# Patient Record
Sex: Female | Born: 1980 | Race: Asian | Hispanic: No | Marital: Married | Smoking: Never smoker
Health system: Southern US, Community
[De-identification: ages and names within clinical notes are randomized; demographics above are authoritative.]

## PROBLEM LIST (undated history)

## (undated) DIAGNOSIS — J45909 Unspecified asthma, uncomplicated: Secondary | ICD-10-CM

## (undated) HISTORY — PX: NO PAST SURGERIES: SHX2092

---

## 2017-09-30 ENCOUNTER — Other Ambulatory Visit (HOSPITAL_COMMUNITY)
Admission: RE | Admit: 2017-09-30 | Discharge: 2017-09-30 | Disposition: A | Discharge status: Home / Self Care | Payer: BLUE CROSS/BLUE SHIELD | Source: Ambulatory Visit | Attending: Family Medicine | Admitting: Family Medicine

## 2017-09-30 ENCOUNTER — Other Ambulatory Visit: Payer: Self-pay | Admitting: Family Medicine

## 2017-09-30 DIAGNOSIS — Z01411 Encounter for gynecological examination (general) (routine) with abnormal findings: Secondary | ICD-10-CM | POA: Diagnosis present

## 2017-10-01 LAB — CYTOLOGY - PAP
DIAGNOSIS: NEGATIVE
HPV (WINDOPATH): NOT DETECTED

## 2019-06-30 ENCOUNTER — Other Ambulatory Visit: Payer: Self-pay

## 2019-06-30 ENCOUNTER — Inpatient Hospital Stay (HOSPITAL_COMMUNITY): Payer: 59

## 2019-06-30 ENCOUNTER — Encounter (HOSPITAL_COMMUNITY): Payer: Self-pay | Admitting: Obstetrics and Gynecology

## 2019-06-30 ENCOUNTER — Inpatient Hospital Stay (HOSPITAL_COMMUNITY)
Admission: AD | Admit: 2019-06-30 | Discharge: 2019-06-30 | Disposition: A | Discharge status: Home / Self Care | Payer: 59 | Attending: Obstetrics and Gynecology | Admitting: Obstetrics and Gynecology

## 2019-06-30 DIAGNOSIS — Z3A08 8 weeks gestation of pregnancy: Secondary | ICD-10-CM | POA: Insufficient documentation

## 2019-06-30 DIAGNOSIS — O208 Other hemorrhage in early pregnancy: Secondary | ICD-10-CM | POA: Insufficient documentation

## 2019-06-30 DIAGNOSIS — O09522 Supervision of elderly multigravida, second trimester: Secondary | ICD-10-CM | POA: Insufficient documentation

## 2019-06-30 DIAGNOSIS — O200 Threatened abortion: Secondary | ICD-10-CM

## 2019-06-30 DIAGNOSIS — O4690 Antepartum hemorrhage, unspecified, unspecified trimester: Secondary | ICD-10-CM

## 2019-06-30 LAB — URINALYSIS, ROUTINE W REFLEX MICROSCOPIC
Bacteria, UA: NONE SEEN
Bilirubin Urine: NEGATIVE
Glucose, UA: NEGATIVE mg/dL
Ketones, ur: NEGATIVE mg/dL
Leukocytes,Ua: NEGATIVE
Nitrite: NEGATIVE
Protein, ur: NEGATIVE mg/dL
Specific Gravity, Urine: 1.003 — ABNORMAL LOW (ref 1.005–1.030)
pH: 7 (ref 5.0–8.0)

## 2019-06-30 LAB — CBC
HCT: 40.2 % (ref 36.0–46.0)
Hemoglobin: 12.5 g/dL (ref 12.0–15.0)
MCH: 20 pg — ABNORMAL LOW (ref 26.0–34.0)
MCHC: 31.1 g/dL (ref 30.0–36.0)
MCV: 64.2 fL — ABNORMAL LOW (ref 80.0–100.0)
Platelets: 210 10*3/uL (ref 150–400)
RBC: 6.26 MIL/uL — ABNORMAL HIGH (ref 3.87–5.11)
RDW: 18.6 % — ABNORMAL HIGH (ref 11.5–15.5)
WBC: 8 10*3/uL (ref 4.0–10.5)
nRBC: 0 % (ref 0.0–0.2)

## 2019-06-30 LAB — ABO/RH: ABO/RH(D): B POS

## 2019-06-30 LAB — HCG, QUANTITATIVE, PREGNANCY: hCG, Beta Chain, Quant, S: 11578 m[IU]/mL — ABNORMAL HIGH (ref <5)

## 2019-06-30 LAB — WET PREP, GENITAL
Clue Cells Wet Prep HPF POC: NONE SEEN
Sperm: NONE SEEN
Trich, Wet Prep: NONE SEEN
Yeast Wet Prep HPF POC: NONE SEEN

## 2019-06-30 LAB — POCT PREGNANCY, URINE: Preg Test, Ur: POSITIVE — AB

## 2019-06-30 NOTE — MAU Provider Note (Signed)
History     CSN: [id number]  Arrival date and time: 06/30/19 1507   First Provider Initiated Contact with Patient 06/30/19 1554      Chief Complaint  Patient presents with  . Vaginal Bleeding   Tracy Stanley is a 39 y.o. G2P1001 at [redacted]w[redacted]d Definite LMP of 05/02/2019.  She states she has her initial prenatal appt scheduled, but does not know what office it is at.  She presents today for Vaginal Bleeding.  She reports she started having blood that started yesterday.  Initially it was dark red in color, but is now a light pink with white discharge. Patient denies abdominal cramping and states she has not had sexual activity since January.  Patient endorses that the bleeding was present upon arrival.       OB History    Gravida  2   Para  1   Term  1   Preterm      AB      Living  1     SAB      TAB      Ectopic      Multiple      Live Births              History reviewed. No pertinent past medical history.  History reviewed. No pertinent surgical history.  Family History  Problem Relation Age of Onset  . Hypertension Father     Social History   Tobacco Use  . Smoking status: Never Smoker  . Smokeless tobacco: Never Used  Substance Use Topics  . Alcohol use: Not Currently  . Drug use: Never    Allergies: No Known Allergies  No medications prior to admission.    Review of Systems  Constitutional: Positive for chills. Negative for fever.  Respiratory: Negative for cough and shortness of breath.   Gastrointestinal: Positive for abdominal pain.  Genitourinary: Positive for vaginal bleeding and vaginal discharge. Negative for difficulty urinating and dysuria.  Neurological: Negative for dizziness, light-headedness and headaches.   Physical Exam   Blood pressure 127/73, pulse 88, temperature 98.1 F (36.7 C), temperature source Oral, resp. rate 18, weight 59 kg, last menstrual period 05/02/2019, SpO2 100 %.  Physical Exam  Constitutional: She is  oriented to person, place, and time. She appears well-developed and well-nourished. No distress.  HENT:  Head: Normocephalic and atraumatic.  Cardiovascular: Normal rate.  Respiratory: Effort normal.  GI: Soft. There is no abdominal tenderness.  Genitourinary: Cervix exhibits discharge and friability.    No vaginal discharge or bleeding.  No bleeding in the vagina.    Genitourinary Comments: Speculum Exam: -Normal External Genitalia: Non tender, no apparent discharge at introitus.  -Vaginal Vault: Pink mucosa. No apparent blood or discharge in vault -wet prep collected -Cervix:Pink, no lesions, cysts, or polyps.  Appears open. No active bleeding from os, but -GC/CT collected -Bimanual Exam:  Deferred    Musculoskeletal:        General: Normal range of motion.     Cervical back: Normal range of motion.  Neurological: She is alert and oriented to person, place, and time.  Skin: Skin is warm and dry.  Psychiatric: She has a normal mood and affect. Her behavior is normal.    MAU Course  Procedures Results for orders placed or performed during the hospital encounter of 06/30/19 (from the past 24 hour(s))  Pregnancy, urine POC     Status: Abnormal   Collection Time: 06/30/19  3:28 PM  Result Value Ref  Range   Preg Test, Ur POSITIVE (A) NEGATIVE  Wet prep, genital     Status: Abnormal   Collection Time: 06/30/19  4:10 PM   Specimen: PATH Cytology Cervicovaginal Ancillary Only  Result Value Ref Range   Yeast Wet Prep HPF POC NONE SEEN NONE SEEN   Trich, Wet Prep NONE SEEN NONE SEEN   Clue Cells Wet Prep HPF POC NONE SEEN NONE SEEN   WBC, Wet Prep HPF POC FEW (A) NONE SEEN   Sperm NONE SEEN   ABO/Rh     Status: None   Collection Time: 06/30/19  4:14 PM  Result Value Ref Range   ABO/RH(D) B POS    No rh immune globuloin      NOT A RH IMMUNE GLOBULIN CANDIDATE, PT RH POSITIVE Performed at [facility], [place] [address], [place], [place] [address]   CBC     Status: Abnormal    Collection Time: 06/30/19  4:14 PM  Result Value Ref Range   WBC 8.0 4.0 - 10.5 K/uL   RBC 6.26 (H) 3.87 - 5.11 MIL/uL   Hemoglobin 12.5 12.0 - 15.0 g/dL   HCT 40.2 36.0 - 46.0 %   MCV 64.2 (L) 80.0 - 100.0 fL   MCH 20.0 (L) 26.0 - 34.0 pg   MCHC 31.1 30.0 - 36.0 g/dL   RDW 18.6 (H) 11.5 - 15.5 %   Platelets 210 150 - 400 K/uL   nRBC 0.0 0.0 - 0.2 %  hCG, quantitative, pregnancy     Status: Abnormal   Collection Time: 06/30/19  4:14 PM  Result Value Ref Range   hCG, Beta Chain, Quant, S 11,578 (H) <5 mIU/mL  Urinalysis, Routine w reflex microscopic     Status: Abnormal   Collection Time: 06/30/19  5:00 PM  Result Value Ref Range   Color, Urine STRAW (A) YELLOW   APPearance CLEAR CLEAR   Specific Gravity, Urine 1.003 (L) 1.005 - 1.030   pH 7.0 5.0 - 8.0   Glucose, UA NEGATIVE NEGATIVE mg/dL   Hgb urine dipstick SMALL (A) NEGATIVE   Bilirubin Urine NEGATIVE NEGATIVE   Ketones, ur NEGATIVE NEGATIVE mg/dL   Protein, ur NEGATIVE NEGATIVE mg/dL   Nitrite NEGATIVE NEGATIVE   Leukocytes,Ua NEGATIVE NEGATIVE   RBC / HPF 0-5 0 - 5 RBC/hpf   WBC, UA 0-5 0 - 5 WBC/hpf   Bacteria, UA NONE SEEN NONE SEEN   Squamous Epithelial / LPF 0-5 0 - 5   US OB LESS THAN 14 WEEKS WITH OB TRANSVAGINAL  Result Date: 06/30/2019 CLINICAL DATA:  Vaginal bleeding EXAM: OBSTETRIC <14 WK [place] AND TRANSVAGINAL OB US TECHNIQUE: Both transabdominal and transvaginal ultrasound examinations were performed for complete evaluation of the gestation as well as the maternal uterus, adnexal regions, and pelvic cul-de-sac. Transvaginal technique was performed to assess early pregnancy. COMPARISON:  None. FINDINGS: Intrauterine gestational sac: Single Yolk sac:  Visualized. Embryo:  Not Visualized. Cardiac Activity: Not Visualized. MSD: 13.5 mm   6 w   1 d Subchorionic hemorrhage: There is a moderate volume of subchorionic hemorrhage. Maternal uterus/adnexae: There is no significant maternal abnormality detected. No significant  free fluid. IMPRESSION: Single IUP at 6 weeks and 1 day without evidence for an embryo. Given the patient is greater than 6 weeks post last menstrual period, findings are suspicious for a failed pregnancy. There is a moderate volume of subchorionic hemorrhage. Findings are suspicious but not yet definitive for failed pregnancy. Recommend follow-up US in 10-14  days for definitive diagnosis. This recommendation follows SRU consensus guidelines: Diagnostic Criteria for Nonviable Pregnancy Early in the First Trimester. Malva Limes Med 2013; 751:0258-52. Electronically Signed   By: Katherine Mantle M.D.   On: 06/30/2019 16:39    MDM Pelvic Exam; Wet Prep and GC/CT Labs: UA, UPT, CBC, hCG, ABO Ultrasound Assessment and Plan  39 year old G2P1001 at 8.3 weeks Vaginal Bleeding Language Barrier  -POC reviewed. -Patient expresses concern with vaginal exam questioning if it will cause miscarriage. -Provider reassures patient that if a miscarriage is occurring, unfortunately pelvic exams or sexual activity does not contribute to it. -Patient informed that she can decline speculum exam, but that it is beneficial in identifying source of bleeding. -Patient declines opting out of exam and states "I trust you." -Exam performed and results discussed. -Cultures collected and pending.  -Labs ordered -Will send for [place] and await results.   Cherre Robins 06/30/2019, 3:55 PM   Reassessment (5:50 PM) Vaginal Bleeding Suspicious for Failed Pregnancy SCH B Positive  -[place] and Lab results return as above. -In room to discuss results with patient with assistance of [employer] Language Line Interpreter Tracy Stanley [phone]. -Extensive review of quant levels and current GA based on LMP. -Patient informed that finding suspicious for miscarriage and that follow up ultrasound would confirm. -Educated on [facility], what to expect including anticipated bleeding and increased risks for miscarriage.  -Patient instructed to have  her husband reschedule her initial PNV for 2 weeks to allow for confirmation of pregnancy vs miscarriage. -Patient verifies that she would have to get an [place] to confirm miscarriage. -Provider reiterated how dates, US findings, and lab results are indicative of likely miscarriage, but that the [place] would truly confirm. -Bleeding Precautions Given -Patient without further questions or concerns.  -Encouraged to call or return to MAU if symptoms worsen or with the onset of new symptoms. -Discharged to home in stable condition.  Cherre Robins MSN, CNM Advanced Practice Provider, Center for [employer]

## 2019-06-30 NOTE — Discharge Instructions (Signed)
Threatened Miscarriage  A threatened miscarriage occurs when a woman has vaginal bleeding during the first 20 weeks of pregnancy but the pregnancy has not ended. If you have vaginal bleeding during this time, your health care provider will do tests to make sure you are still pregnant. If the tests show that you are still pregnant and that the developing baby (fetus) inside your uterus is still growing, your condition is considered a threatened miscarriage. A threatened miscarriage does not mean your pregnancy will end, but it does increase the risk of losing your pregnancy (complete miscarriage). What are the causes? The cause of this condition is usually not known. For women who go on to have a complete miscarriage, the most common cause is an abnormal number of chromosomes in the developing baby. Chromosomes are the structures inside cells that hold all of a person's genetic material. What increases the risk? The following lifestyle factors may increase your risk of a miscarriage in early pregnancy:  Smoking.  Drinking excessive amounts of alcohol or caffeine.  Recreational drug use. The following preexisting health conditions may increase your risk of a miscarriage in early pregnancy:  Polycystic ovary syndrome.  Uterine fibroids.  Infections.  Diabetes mellitus. What are the signs or symptoms? Symptoms of this condition include:  Vaginal bleeding.  Mild abdominal pain or cramps. How is this diagnosed? If you have bleeding with or without abdominal pain before 20 weeks of pregnancy, your health care provider will do tests to check whether you are still pregnant. These will include:  Ultrasound. This test uses sound waves to create images of the inside of your uterus. This allows your health care provider to look at your developing baby and other structures, such as your placenta.  Pelvic exam. This is an internal exam of your vagina and cervix.  Measurement of your baby's heart  rate.  Laboratory tests such as blood tests, urine tests, or swabs for infection You may be diagnosed with a threatened miscarriage if:  Ultrasound testing shows that you are still pregnant.  Your baby's heart rate is strong.  A pelvic exam shows that the opening between your uterus and your vagina (cervix) is closed.  Blood tests confirm that you are still pregnant. How is this treated? No treatments have been shown to prevent a threatened miscarriage from going on to a complete miscarriage. However, the right home care is important. Follow these instructions at home:  Get plenty of rest.  Do not have sex or use tampons if you have vaginal bleeding.  Do not douche.  Do not smoke or use recreational drugs.  Do not drink alcohol.  Avoid caffeine.  Keep all follow-up prenatal visits as told by your health care provider. This is important. Contact a health care provider if:  You have light vaginal bleeding or spotting while pregnant.  You have abdominal pain or cramping.  You have a fever. Get help right away if:  You have heavy vaginal bleeding.  You have blood clots coming from your vagina.  You pass tissue from your vagina.  You leak fluid, or you have a gush of fluid from your vagina.  You have severe low back pain or abdominal cramps.  You have fever, chills, and severe abdominal pain. Summary  A threatened miscarriage occurs when a woman has vaginal bleeding during the first 20 weeks of pregnancy but the pregnancy has not ended.  The cause of a threatened miscarriage is usually not known.  Symptoms of this condition may   include vaginal bleeding and mild abdominal pain or cramps.  No treatments have been shown to prevent a threatened miscarriage from going on to a complete miscarriage.  Keep all follow-up prenatal visits as told by your health care provider. This is important. This information is not intended to replace advice given to you by your health  care provider. Make sure you discuss any questions you have with your health care provider. Document Revised: 05/28/2017 Document Reviewed: 07/18/2016 Elsevier Patient Education  2020 Elsevier Inc.  

## 2019-06-30 NOTE — MAU Note (Signed)
.   Tracy Stanley is a 39 y.o. at [redacted]w[redacted]d here in MAU reporting: Patient had VB that started yesterday morning at 1100 that was dark red and she also passed a small blood clot. She states that the bleeding has continued today but is now only spotting and she also has thick white discharge. Patient states she had lower abdominal pain this morning she would rate 3/10 but no current abdominal pain.   Pain score: 0 Vitals:   06/30/19 1533  BP: 127/73  Pulse: 88  Resp: 18  Temp: 98.1 F (36.7 C)  SpO2: 100%

## 2019-07-01 LAB — GC/CHLAMYDIA PROBE AMP (~~LOC~~) NOT AT ARMC
Chlamydia: NEGATIVE
Comment: NEGATIVE
Comment: NORMAL
Neisseria Gonorrhea: NEGATIVE

## 2019-07-04 ENCOUNTER — Inpatient Hospital Stay (HOSPITAL_COMMUNITY): Payer: 59

## 2019-07-04 ENCOUNTER — Inpatient Hospital Stay (HOSPITAL_COMMUNITY)
Admission: AD | Admit: 2019-07-04 | Discharge: 2019-07-04 | Disposition: A | Discharge status: Home / Self Care | Payer: 59 | Attending: Obstetrics and Gynecology | Admitting: Obstetrics and Gynecology

## 2019-07-04 ENCOUNTER — Other Ambulatory Visit: Payer: Self-pay

## 2019-07-04 DIAGNOSIS — O039 Complete or unspecified spontaneous abortion without complication: Secondary | ICD-10-CM | POA: Diagnosis not present

## 2019-07-04 DIAGNOSIS — O209 Hemorrhage in early pregnancy, unspecified: Secondary | ICD-10-CM | POA: Diagnosis present

## 2019-07-04 LAB — URINALYSIS, ROUTINE W REFLEX MICROSCOPIC
Bilirubin Urine: NEGATIVE
Glucose, UA: NEGATIVE mg/dL
Ketones, ur: NEGATIVE mg/dL
Leukocytes,Ua: NEGATIVE
Nitrite: NEGATIVE
Protein, ur: NEGATIVE mg/dL
Specific Gravity, Urine: 1.002 — ABNORMAL LOW (ref 1.005–1.030)
pH: 6 (ref 5.0–8.0)

## 2019-07-04 MED ORDER — OXYCODONE-ACETAMINOPHEN 5-325 MG PO TABS
2.0000 | ORAL_TABLET | Freq: Once | ORAL | Status: AC
  Administered 2019-07-04: 2 via ORAL
  Filled 2019-07-04: qty 2

## 2019-07-04 NOTE — MAU Provider Note (Signed)
Chief Complaint: Vaginal Bleeding   First Provider Initiated Contact with Patient 07/04/19 2056     *video [place] interpreter used for this encounter*  SUBJECTIVE HPI: Tracy Stanley is a 39 y.o. G2P1001 at [redacted]w[redacted]d by sure LMP who presents to Maternity Admissions reporting vaginal bleeding. Was in MAU last week & diagnosed with threatened miscarriage. Was supposed to go to an ob/gyn where she was scheduled for prenatal care, but doesn't know the name of the office and ended up cancelling her appointment.  Reports heavy bleeding & cramping today from 4-6 pm. Was saturating pads and passing small clots. Passed something that looked like it may have been tissue. Since the her abdominal pain has improved & her bleeding has slowed down. Currently rates lower abdominal pain as 6/10 on pain scale. Describes as intermittent cramping. Has not treated symptoms. Nothing makes pain worse. Pain started earlier today.    No past medical history on file. OB History  Gravida Para Term Preterm AB Living  2 1 1     1   SAB TAB Ectopic Multiple Live Births               # Outcome Date GA Lbr Len/2nd Weight Sex Delivery Anes PTL Lv  2 Current           1 Term 08/28/13    M Vag-Spont      No past surgical history on file. Social History   Socioeconomic History  . Marital status: Married    Spouse name: Not on file  . Number of children: Not on file  . Years of education: Not on file  . Highest education level: Not on file  Occupational History  . Not on file  Tobacco Use  . Smoking status: Never Smoker  . Smokeless tobacco: Never Used  Substance and Sexual Activity  . Alcohol use: Not Currently  . Drug use: Never  . Sexual activity: Not Currently  Other Topics Concern  . Not on file  Social History Narrative  . Not on file   Social Determinants of Health   Financial Resource Strain:   . Difficulty of Paying Living Expenses: Not on file  Food Insecurity:   . Worried About Charity fundraiser  in the Last Year: Not on file  . Ran Out of Food in the Last Year: Not on file  Transportation Needs:   . Lack of Transportation (Medical): Not on file  . Lack of Transportation (Non-Medical): Not on file  Physical Activity:   . Days of Exercise per Week: Not on file  . Minutes of Exercise per Session: Not on file  Stress:   . Feeling of Stress : Not on file  Social Connections:   . Frequency of Communication with Friends and Family: Not on file  . Frequency of Social Gatherings with Friends and Family: Not on file  . Attends Religious Services: Not on file  . Active Member of Clubs or Organizations: Not on file  . Attends Archivist Meetings: Not on file  . Marital Status: Not on file  Intimate Partner Violence:   . Fear of Current or Ex-Partner: Not on file  . Emotionally Abused: Not on file  . Physically Abused: Not on file  . Sexually Abused: Not on file   Family History  Problem Relation Age of Onset  . Hypertension Father    No current facility-administered medications on file prior to encounter.   No current outpatient medications on file prior  to encounter.   No Known Allergies  I have reviewed patient's Past Medical Hx, Surgical Hx, Family Hx, Social Hx, medications and allergies.   Review of Systems  Constitutional: Negative.   Gastrointestinal: Positive for abdominal pain.  Genitourinary: Positive for vaginal bleeding.    OBJECTIVE Patient Vitals for the past 24 hrs:  BP Temp Pulse Resp  07/04/19 2336 116/78 -- 70 16  07/04/19 2026 111/70 98.5 F (36.9 C) 73 17   Constitutional: Well-developed, well-nourished female in no acute distress.  Cardiovascular: normal rate & rhythm, no murmur Respiratory: normal rate and effort. Lung sounds clear throughout GI: Abd soft, non-tender, Pos BS x 4. No guarding or rebound tenderness MS: Extremities nontender, no edema, normal ROM Neurologic: Alert and oriented x 4.      LAB RESULTS Results for orders  placed or performed during the hospital encounter of 07/04/19 (from the past 24 hour(s))  Urinalysis, Routine w reflex microscopic     Status: Abnormal   Collection Time: 07/04/19  8:48 PM  Result Value Ref Range   Color, Urine AMBER (A) YELLOW   APPearance CLEAR CLEAR   Specific Gravity, Urine 1.002 (L) 1.005 - 1.030   pH 6.0 5.0 - 8.0   Glucose, UA NEGATIVE NEGATIVE mg/dL   Hgb urine dipstick LARGE (A) NEGATIVE   Bilirubin Urine NEGATIVE NEGATIVE   Ketones, ur NEGATIVE NEGATIVE mg/dL   Protein, ur NEGATIVE NEGATIVE mg/dL   Nitrite NEGATIVE NEGATIVE   Leukocytes,Ua NEGATIVE NEGATIVE   RBC / HPF 0-5 0 - 5 RBC/hpf   WBC, UA 0-5 0 - 5 WBC/hpf   Bacteria, UA FEW (A) NONE SEEN   Squamous Epithelial / LPF 0-5 0 - 5    IMAGING US OB Transvaginal  Result Date: 07/04/2019 CLINICAL DATA:  39 year old female with pelvic pain and vaginal bleeding. EXAM: TRANSVAGINAL OB ULTRASOUND TECHNIQUE: Transvaginal ultrasound was performed for complete evaluation of the gestation as well as the maternal uterus, adnexal regions, and pelvic cul-de-sac. COMPARISON:  Pelvic ultrasound dated 06/30/2019. FINDINGS: The uterus is retroverted or retroflexed. The endometrium is thickened and slightly heterogeneous measuring up to 11 mm in thickness. No intrauterine pregnancy identified. A small complex hypoechoic collection in the lower endometrium likely represents blood product/clot. Retained products of conception is less likely but not excluded. Clinical correlation and follow-up with serial HCG levels recommended. The ovaries are unremarkable. There is a dominant follicle or cyst in the right ovary. No significant free fluid within the pelvis. IMPRESSION: Interval passage of the previously seen intrauterine gestational sac in keeping with spontaneous miscarriage. Small amount of blood/clot within the endometrium. Clinical correlation and follow-up with serial HCG levels recommended to exclude retained products of  conception. Electronically Signed   By: Elgie Collard M.D.   On: 07/04/2019 22:47    MAU COURSE Orders Placed This Encounter  Procedures  . US OB Transvaginal  . Urinalysis, Routine w reflex microscopic  . Discharge patient   Meds ordered this encounter  Medications  . oxyCODONE-acetaminophen (PERCOCET/ROXICET) 5-325 MG per tablet 2 tablet    MDM Vital signs stable Small amount of dark red blood on pad that has been in place for an hour  Ultrasound - IUP previously seen no longer there, c/w miscarriage  Discussed results with patient via [place] interpreter who is appropriately upset. Discussed follow up with her - her preference would be to come to our [place] office - I will send message to schedule SAB appt  Pt requesting pain medication prior to  discharge - percocet given & pain rated 4/10 at time of discharge  RH positive  ASSESSMENT 1. Miscarriage   2. Vaginal bleeding in pregnancy, first trimester     PLAN Discharge home in stable condition. Bleeding & infection precautions Msg to [facility] for SAB f/u appt  Follow-up Information    [facility] Follow up.   Specialty: Obstetrics and Gynecology Why: the office will call you to schedule your follow up appointment Contact information: [address] Floor, [address] [id number] [address] [address] [phone]         Allergies as of 07/04/2019   No Known Allergies     Medication List    You have not been prescribed any medications.      Judeth Horn, NP 07/05/2019  12:14 AM

## 2019-07-04 NOTE — MAU Note (Signed)
Patient reports an increase in her bleeding and abdominal pain.  Has not taken anything for her pain.  States she has been passing lemon sized blood clots.  Also passed something that looked like tissue.

## 2019-07-04 NOTE — Discharge Instructions (Signed)
S?y [place] Miscarriage S?y thai l [place] nhi (bo [place]) b? ra kh?i t? cung tr??c tu?n th? 20 c?a [place] k?. H?u h?t cc tr??ng h?p s?y [place] x?y ra trong 3 thng ??u c?a [place] k?. ?i khi, s?y [place] c th? x?y ra tr??c khi m?t ng??i ph? n? bi?t r?ng mnh mang [place]. B? s?y thai c th? l m?t tr?i nghi?m c?m xc. N?u qu v? b? s?y [place], hy trao ??i v?i chuyn gia ch?m [place] s?c kh?e v? b?t k? cu h?i no qu v? c th? c v? s?y [place], qu trnh ?au bu?n v k? ho?ch mang thai trong t??ng lai. Nguyn nhn g gy ra? S?y thai c th? la? do:  Cc v?n ?? v?i gen ho?c nhi?m s?c th? c?a [place] nhi. Nh?ng v?n ?? ny khi?n cho thai nhi khng th? pht tri?n bnh th??ng. Cc v?n ?? ? th??ng l k?t qu? c?a cc sai st ng?u nhin x?y ra ? giai ?o?n ??u trong qu trnh pht tri?n c?a em b v khng ???c truy?n t? cha m? sang con Multimedia programmer di truy?n).  Nhi?m trng c? t? cung ho?c t? cung.  Cc tnh tr?ng ?nh h??ng ??n cn b?ng hc mn trong c? th?.  Cc v?n ?? v?i c? t? cung, ch?ng h?n nh? c? t? cung m? ho?c m?ng tr??c khi ??n k? sinh (suy c? t? cung).  Cc v?n ?? v?i t? cung. Nh?ng v?n ?? ny c th? bao g?m: ? T? cung c hnh d?ng b?t th??ng. ? U x? t? cung. ? Cc b?t th??ng b?m sinh. ?y l nh?ng v?n ?? c vo lc sinh ra.  M?t s? tnh tr?ng b?nh l nh?t ??nh.  Ht thu?c l, u?ng r??u ho?c dng ma ty.  T?n th??ng (ch?n th??ng). Trong nhi?u tr??ng h?p, khng r nguyn nhn gy s?y [place]. Cc d?u hi?u ho?c tri?u ch?ng l g? Nh?ng tri?u ch?ng c?a tnh tr?ng ny bao g?m:  Ch?y mu ho?c ??m mu ? m ??o, c ho?c khng b? co th?t ho?c ?au.  ?au ho?c co th?t ? b?ng ho?c th?t l?ng.  Ra d?ch, m ho?c c?c mu ?ng ? m ??o. Ch?n ?on tnh tr?ng ny nh? th? no? Tnh tr?ng ny c th? ???c ch?n ?on d?a vo:  Khm th?c th?.  Siu m.  Xt nghi?m mu.  Xt nghi?m n??c ti?u. Tnh tr?ng ny ???c ?i?u tr? nh? th? no? ?i khi khng c?n ?i?u tr? s?y [place] n?u t?t c? m bo [place] trong t? cung thot ra ngoi m?t cch  t? nhin. N?u c?n, tnh tr?ng ny c th? ???c ?i?u tr? b?ng:  Nong v n?o (D[employer]C). ?y l m?t th? thu?t trong ? c? t? cung ???c nong m? ra v l?p nim m?c t? cung (n?i m?c) ???c n?o ra. Th? thu?t ny ch? ???c th?c hi?n n?u m bo [place] ho?c nhau thai v?n cn trong c? th? (s?y thai khng hon ton).  Thu?c, ch?ng h?n nh?: ? Thu?c khng sinh ?? ?i?u tr? nhi?m trng. ? Thu?c ?? gip c? th? ??y b?t c? ph?n m no cn l?i ra. ? Thu?c ?? gi?m (co) kch th??c c?a t? cung. Nh?ng thu?c ny c th? ???c dng n?u qu v? ch?y nhi?u mu. N?u qu v? c nhm mu Rh m tnh ho?c con qu v? c Rh d??ng tnh, qu v? s? c?n m?t m?i thu?c tim tn l Rh globulin mi?n d?ch?? b?o v? cc con sau ny c?a qu v? kh?i b? cc v?n ??  v? nhm mu Rh. "Rh m tnh" v "Rh d??ng tnh" ?? c?p ??n vi?c mu c m?t lo?i protein ??c hi?u tm th?y trn b? m?t h?ng c?u (y?u t? Rh) hay khng. Tun th? nh?ng h??ng d?n ny ? nh: Thu?c   Ch? s? d?ng thu?c khng k ??n v thu?c k ??n theo ch? d?n c?a chuyn gia ch?m [place] s?c kh?e.  N?u qu v? ???c k thu?c khng sinh, hy dng thu?c theo ch? d?n c?a chuyn gia ch?m [place] s?c kh?e. Khng d?ng u?ng thu?c khng sinh ngay c? khi qu v? b?t ??u c?m th?y ?? h?n.  Khng dng thu?c ch?ng vim khng steroid (NSAID), ch?ng h?n nh? aspirin v ibuprofen, tr? khi ???c chuyn gia ch?m [place] s?c kh?e cho php. Nh?ng thu?c ny c th? gy ch?y mu. Ho?t ??ng  Ngh? ng?i theo ch? d?n. H?i chuyn gia ch?m [place] s?c kh?e v? cc ho?t ??ng no an ton cho qu v?.  Nh? ai ? gip gi?i quy?t cc trch nhi?m gia ?nh v vi?c nh trong th?i gian ny. H??ng d?n chung  Theo di s? l??ng b?ng v? sinh qu v? s? d?ng m?i ngy v m?c ?? ??t (bo [place]) c?a b?ng v? sinh. Ghi l?i thng tin ny.  Theo di l??ng m ho?c c?c mu ?ng ra kh?i m ??o c?a qu v?. Gi? l?i b?t c? l??ng m no l?n cho chuyn gia ch?m [place] s?c kh?e ki?m tra.  Khng s?? du?ng nu?t b?ng v? sinh, thu?t r??a ho?c quan h? tnh d?c cho ??n khi ???c chuyn  gia ch?m so?c s??c kho?e ch?p thu?n.  ?? gip qu v? v b?n tnh c?a qu v? v??t qua giai ?o?n ?au bu?n, hy trao ??i v?i chuyn gia ch?m [place] s?c kh?e ho?c tm ng??i t? v?n.  Khi qu v? s?n sng, hy g?p chuyn gia ch?m [place] s?c kh?e ?? th?o lu?n v? b?t c? b??c quan tr?ng no c?n th?c hi?n v s?c kh?e c?a qu v?. ??ng th?i, th?o lu?n nh?ng b??c qu v? c?n th?c hi?n ?? c m?t thai k? kh?e m?nh trong t??ng lai.  Tun th? t?t c? cc l?n khm theo di theo ch? d?n c?a chuyn gia ch?m [place] s?c kh?e. ?i?u ny c vai tr quan tr?ng. N?i ?? tm thm thng tin  Hi?p h?i S?c kh?e S?n Ph? [place] Giovanni K?: [email or web address]  B? Y t? v D?ch v? [place] Ph? n?: [email or web address] Hy lin l?c v?i chuyn gia ch?m [place] s?c kh?e n?u:  Qu v? b? s?t ho?c ?n l?nh.  Qu v? c kh h? ? m ??o mi kh ch?u.  Qu v? b? ch?y mu nhi?u h?n thay v t h?n. Yu c?u tr? gip ngay l?p t?c n?u:  Qu v? b? co th?t n?ng ho?c ?au r?t nhi?u ? l?ng ho?c b?ng.  Qu v? c c?c mu ?ng ho?c m thot ra kh?i m ??o c kch th??c b?ng h?t c ch tr? ln.  Qu v? lm ??t h?n 1 mi?ng b?ng v? sinh bnh th??ng trong m?t gi?Sander Nephew v? b? chong vng ho?c ?m y?u.  Qu v? b? b?t t?nh.  Qu v? c c?m gic bu?n xm chi?m t? t??ng c?a mnh ho?c c suy ngh? t? lm h?i mnh. Tm t?t  H?u h?t cc tr??ng h?p s?y [place] x?y ra trong 3 thng ??u c?a [place] k?. ?i khi s?y [place] x?y ra tr??c c? khi ng??i ph? n? bi?t mnh c [place].  Tun th? ch? ??n c?a chuyn gia ch?m [place] s?c kh?e v? vi?c ch?m [place] ? nh. Tun theo t?t c? cc cu?c h?n khm l?i.  ?? gip qu v? v b?n tnh c?a qu v? v??t qua giai ?o?n ?au bu?n, hy trao ??i v?i chuyn gia ch?m [place] s?c kh?e ho?c tm ng??i t? v?n. Thng tin ny khng nh?m m?c ?ch thay th? cho l?i khuyn m chuyn gia ch?m [place] s?c kh?e ni v?i qu v?. Hy b?o ??m qu v? ph?i th?o lu?n b?t k? v?n ?? g m qu v? c v?i chuyn gia ch?m  s?c kh?e c?a qu v?. Document Revised:  10/07/2016 Document Reviewed: 10/07/2016 Elsevier Patient Education  2020 Elsevier Inc.   Qu?n l tnh tr?ng m?t thai Managing Pregnancy Loss M?t [place] c th? x?y ra b?t c? lc no trong khi mang [place]. Th??ng khng bi?t nguyn nhn. Hi?m khi nguyn nhn l do b?t c? ?i?u g qu v? ? lm. M?t thai trong th?i k? ??u mang thai (trong ba thng mang thai ??u tin) ???c g?i l s?y [place]. Lo?i m?t thai ny l th??ng g?p nh?t. M?t [place] x?y ra sau 20 tu?n mang thai ???c g?i l t? vong [place] nhi n?u tim c?a em b ng?ng ??p tr??c khi sinh. T? vong thai nhi t ph? bi?n h?n nhi?u. M?t s? ph? n? c chuy?n d? t? nhin ngay sau khi [place] nhi t? vong r?i sinh ra [place] nhi ? ch?t (thai ch?t l?u). B?t k? tr??ng h?p m?t thai no c?ng c th? gy suy s?p. Qu v? s? c?n ph?c h?i c? th? ch?t v c?m xc. H?u h?t n? gi?i c th? mang thai tr? l?i sau khi m?t thai v sinh con kh?e m?nh. Cch ki?m sot s? ph?c h?i c?m xc  M?t thai l chuy?n r?t ?au lng. Qu v? c th? c?m th?y nhi?u c?m xc khc nhau trong khi ?au bu?n. Qu v? c th? c?m th?y bu?n v t?c gi?n. Qu v? c?ng c th? c?m th?y t?i l?i. Qu v? khc nhi?u l bnh th??ng. Vi?c ph?c h?i c?m xc c th? m?t nhi?u th?i gian h?n l ph?c h?i th? ch?t. M?i ng??i ph?c h?i khc nhau. Th?c hi?n cc b??c ny c th? gip qu v? ki?m sot m?t mt ny:  Hy nh? r?ng qu v? khng c kh? n?ng lm b?t k? ?i?u g gy ra m?t [place].  Hy chia s? suy ngh? v c?m xc v?i b?n b, gia ?nh v ng??i ph?i ng?u c?a qu v?. Hy nh? r?ng ng??i ph?i ng?u c?a qu v? c?ng ?ang ph?c h?i c?m xc.  Hy ch?c ch?n r?ng qu v? c m?t h? th?ng h? tr? t?t. Khng dnh qu nhi?u th?i gian ? m?t mnh.  G?p chuyn gia t? v?n m?t thai ho?c tham gia nhm h? tr? m?t [place].  Ng? ?? gi?c v ?n m?t ch? ?? ?n co? l??i cho s??c kho?e. Quay l?i ch? ?? t?p luy?n th??ng l? khi qu v? ? ph?c h?i th? ch?t.  Khng s? d?ng ma ty ho?c r??u ?? ki?m sot c?m xc c?a qu v?.  Cn nh?c g?p chuyn gia s?c kh?e tm th?n ??  gip qu v? ph?c h?i c?m xc.  Nh? b?n b ho?c ng??i thn gip qu v? quy?t ??nh ph?i lm g v?i qu?n o v cc mn ?? nui con m qu v? ? nh?n ???c cho con qu qu v?. Trong tr??ng h?p [place] ch?t l?u, nhi?u ph? n? h??ng l?i  t? vi?c th?c hi?n cc b??c b? sung trong qu trnh ?au bu?n. Qu v? c th? mu?n:  m con sau khi sinh.  ??t tn cho con.  Yu c?u c?p gi?y khai sinh.  T?o m?t v?t l?u ni?m nh? tranh in bn tay ho?c bn chn.  M?c qu?n o cho b v ch?p ?nh.  S?p x?p l? tang.  Yu c?u l? r?a t?i ho?c c?u nguy?n. Cc b?nh vi?n c nhn vin c th? gip qu v? s?p x?p t?t nh?ng nh?ng vi?c ny. Cch nh?n bi?t c?ng th?ng c?m xc Vi?c b? c?ng th?ng c?m xc sau khi m?t thai l bnh th??ng. Nh?ng c?ng th?ng c?m xc di?n ra trong th?i gian di ho?c tr? n?ng s? c?n ???c ?i?u tr?Marland Kitchen Hy theo di cc d?u hi?u c?a tnh tr?ng c?ng th?ng c?m xc ? m?c ?? n?ng:  Bu?n, t?c gi?n ho?c th?y t?i l?i khng h?t v gy ?nh h??ng x?u ??n cc ho?t ??ng bnh th??ng c?a qu v?.  Cc v?n ?? v? m?i quan h? ? x?y ra ho?c tr? nn tr?m tr?ng h?n do m?t [place].  Cc d?u hi?u tr?m c?m ko di h?n 2 tu?n. Nh?ng d?u hi?u ny c th? bao g?m: ? Bu?n b. ? Lo u. ? V v?ng. ? Khng cn [place] tm ??n cc ho?t ??ng qu v? yu thch. ? Khng th? t?p trung. ? Kh ng? ho?c ng? qu nhi?u. ? Chn ?n ho?c ?n qu nhi?u. ?  ngh? t? t? ho?c t? gy t?n th??ng b?n thn. Tun th? nh?ng h??ng d?n ny ? nh:  Ch? s? d?ng thu?c khng k ??n v thu?c k ??n theo ch? d?n c?a chuyn gia ch?m [place] s?c kh?e.  Ngh? ng?i t?i nh cho ??n khi m?c n?ng l??ng tr? l?i. Tr? l?i sinh ho?t bnh th??ng theo ch? d?n c?a chuyn gia ch?m [place] s?c kh?e. Hy h?i chuyn gia ch?m [place] s?c kh?e v? cc ho?t ??ng no l an ton cho qu v?.  Khi ? s?n sng, hy g?p chuyn gia ch?m [place] s?c kh?e ?? th?o lu?n cc b??c c?n th?c hi?n cho l?n mang thai sau ny.  Tun th? t?t c? cc l?n khm theo di theo ch? d?n c?a chuyn gia ch?m [place] s?c kh?e. ?i?u ny c vai  tr quan tr?ng. Tm ki?m tr? gip ? ?u  ?? gip qu v? v ng??i ph?i ng?u c?a qu v? v??t qua giai ?o?n ?au bu?n, hy trao ??i v?i chuyn gia ch?m [place] s?c kh?e ho?c tm ng??i t? v?n.  Cn nh?c g?p g? nh?ng ng??i khc ? b? m?t [place]. Hy h?i chuyn gia ch?m [place] s?c kh?e v? cc nhm h? tr? v ngu?n l?c. N?i ?? tm thm thng tin  [place] Department of Health and Cytogeneticist on [employer] (B? Y t? v D?ch v? Nhn sinh [place]?, V?n phng S?c kho? Ph? n?): http://[email or web address].[email or web address]/  American Pregnancy Association (Hi?p h?i Mang [place] M?): www.americanpregnancy.org Hy lin l?c v?i chuyn gia ch?m [place] s?c kh?e n?u:  Qu v? ti?p t?c th?y ?au kh?, bu?n b ho?c thi?u ??ng l?c cho cc ho?t ??ng hng ngy v nh?ng c?m xc ? khng c?i thi?n theo th?i gian.  Qu v? ?ang g?p kh kh?n ?? khi ph?c tnh c?m, ??c bi?t l n?u qu v? ?ang s? d?ng r??u ho?c cc ch?t ?? tr? gip. Yu c?u tr? gip ngay l?p t?c n?u:  Qu v? c suy ngh? t? lm t?n th??ng b?n thn ho?c ng??i  khc. N?u qu v? ? t?ng c?m th?y nh? c th? t? lm th??ng t?n b?n thn ho?c ng??i khc, ho?c c suy ngh? v? vi?c t? t?, hy yu c?u s? tr? gip ngay l?p t?c. Qu v? c th? ??n phng c?p c?u g?n nh?t ho?c g?i cho:  D?ch v? c?p c?u t?i ??a ph??ng (911 ? [place]?).  ???ng dy tr? gip kh?ng ho?ng t? t?, nh? l National Suicide Prevention Lifeline (???ng dy C?u sinh Qu?c gia Ng?n ng?a T? t?) theo s? [phone]. ???ng dy ny ho?t ??ng 24 gi? m?i ngy. Tm t?t  B?t k? l?n m?t thai no c?ng c th? kh kh?n v? c? th? ch?t v c?m xc.  Qu v? c th? tr?i qua nhi?u c?m xc khc nhau trong khi ?au bu?n. Vi?c ph?c h?i c?m xc c th? m?t nhi?u th?i gian h?n l ph?c h?i th? ch?t.  Vi?c b? c?ng th?ng c?m xc sau khi m?t thai l bnh th??ng. Nh?ng c?ng th?ng c?m xc di?n ra trong th?i gian di ho?c tr? n?ng s? c?n ???c ?i?u tr?.  Hy g?p chuyn gia ch?m [place] s?c kh?e n?u qu v? ?ang g?p kh kh?n sau khi m?t [place]. Thng tin ny khng nh?m m?c  ?ch thay th? cho l?i khuyn m chuyn gia ch?m [place] s?c kh?e ni v?i qu v?. Hy b?o ??m qu v? ph?i th?o lu?n b?t k? v?n ?? g m qu v? c v?i chuyn gia ch?m [place] s?c kh?e c?a qu v?. Document Revised: 08/03/2018 Document Reviewed: 08/03/2018 Elsevier Patient Education  2020 [employer].

## 2019-07-07 ENCOUNTER — Telehealth: Payer: Self-pay | Admitting: Obstetrics and Gynecology

## 2019-07-07 NOTE — Telephone Encounter (Addendum)
Pt's husband Ethelene Browns had also called office and left VM message regarding this issue. Per chart review, I do not see any notes form MAU visit on 3/1 that a prescription was given. I called pt's husband [phone]) who speaks English and got his voicemail. I left a message stating that I have some questions and will call back later today.   1610  Called and spoke w/pt's husband Ethelene Browns. He stated that his wife thought she was going to be prescribed Oxycodone for pain. He stated that the Rx has not been received by [facility] pharmacy. Per chart review, this information is not reflected in MAU notes from Judeth Horn on 07/04/19. I advised that [place] can take tylenol or Ibuprofen per package directions for pain. Pt has follow up appt in our office on 07/21/19 @ 1015. Ethelene Browns voiced understanding of all information and stated he will tell his wife.

## 2019-07-07 NOTE — Telephone Encounter (Signed)
Called the patient to inform of upcoming appointment. The patient verbalized understanding. Also stated she would like a letter with our office address as she does not have mychart setup.

## 2019-07-07 NOTE — Telephone Encounter (Signed)
The patient stated she was told she would have medication ready for her at [facility] however when she went to pick it up they said they did not have anything there. She would like a nurse to follow up. She has not been to our office as of yet. She had a miscarriage and has follow up with [place] in 2 weeks.

## 2019-07-21 ENCOUNTER — Ambulatory Visit (INDEPENDENT_AMBULATORY_CARE_PROVIDER_SITE_OTHER): Payer: 59 | Admitting: Obstetrics and Gynecology

## 2019-07-21 ENCOUNTER — Encounter: Payer: Self-pay | Admitting: Obstetrics and Gynecology

## 2019-07-21 ENCOUNTER — Other Ambulatory Visit: Payer: Self-pay

## 2019-07-21 VITALS — BP 105/67 | HR 68 | Ht 62.21 in | Wt 126.0 lb

## 2019-07-21 DIAGNOSIS — O039 Complete or unspecified spontaneous abortion without complication: Secondary | ICD-10-CM | POA: Diagnosis not present

## 2019-07-21 NOTE — Addendum Note (Signed)
Addended by: Duwaine Maxin on: 07/21/2019 10:56 AM   Modules accepted: Orders

## 2019-07-21 NOTE — Patient Instructions (Signed)
S?y [place] Miscarriage S?y thai l [place] nhi (bo [place]) b? ra kh?i t? cung tr??c tu?n th? 20 c?a [place] k?. H?u h?t cc tr??ng h?p s?y [place] x?y ra trong 3 thng ??u c?a [place] k?. ?i khi, s?y [place] c th? x?y ra tr??c khi m?t ng??i ph? n? bi?t r?ng mnh mang [place]. B? s?y thai c th? l m?t tr?i nghi?m c?m xc. N?u qu v? b? s?y [place], hy trao ??i v?i chuyn gia ch?m [place] s?c kh?e v? b?t k? cu h?i no qu v? c th? c v? s?y [place], qu trnh ?au bu?n v k? ho?ch mang thai trong t??ng lai. Nguyn nhn g gy ra? S?y thai c th? la? do:  Cc v?n ?? v?i gen ho?c nhi?m s?c th? c?a [place] nhi. Nh?ng v?n ?? ny khi?n cho thai nhi khng th? pht tri?n bnh th??ng. Cc v?n ?? ? th??ng l k?t qu? c?a cc sai st ng?u nhin x?y ra ? giai ?o?n ??u trong qu trnh pht tri?n c?a em b v khng ???c truy?n t? cha m? sang con Multimedia programmer di truy?n).  Nhi?m trng c? t? cung ho?c t? cung.  Cc tnh tr?ng ?nh h??ng ??n cn b?ng hc mn trong c? th?.  Cc v?n ?? v?i c? t? cung, ch?ng h?n nh? c? t? cung m? ho?c m?ng tr??c khi ??n k? sinh (suy c? t? cung).  Cc v?n ?? v?i t? cung. Nh?ng v?n ?? ny c th? bao g?m: ? T? cung c hnh d?ng b?t th??ng. ? U x? t? cung. ? Cc b?t th??ng b?m sinh. ?y l nh?ng v?n ?? c vo lc sinh ra.  M?t s? tnh tr?ng b?nh l nh?t ??nh.  Ht thu?c l, u?ng r??u ho?c dng ma ty.  T?n th??ng (ch?n th??ng). Trong nhi?u tr??ng h?p, khng r nguyn nhn gy s?y [place]. Cc d?u hi?u ho?c tri?u ch?ng l g? Nh?ng tri?u ch?ng c?a tnh tr?ng ny bao g?m:  Ch?y mu ho?c ??m mu ? m ??o, c ho?c khng b? co th?t ho?c ?au.  ?au ho?c co th?t ? b?ng ho?c th?t l?ng.  Ra d?ch, m ho?c c?c mu ?ng ? m ??o. Ch?n ?on tnh tr?ng ny nh? th? no? Tnh tr?ng ny c th? ???c ch?n ?on d?a vo:  Khm th?c th?.  Siu m.  Xt nghi?m mu.  Xt nghi?m n??c ti?u. Tnh tr?ng ny ???c ?i?u tr? nh? th? no? ?i khi khng c?n ?i?u tr? s?y [place] n?u t?t c? m bo [place] trong t? cung thot ra ngoi m?t cch  t? nhin. N?u c?n, tnh tr?ng ny c th? ???c ?i?u tr? b?ng:  Nong v n?o ([employer]). ?y l m?t th? thu?t trong ? c? t? cung ???c nong m? ra v l?p nim m?c t? cung (n?i m?c) ???c n?o ra. Th? thu?t ny ch? ???c th?c hi?n n?u m bo [place] ho?c nhau thai v?n cn trong c? th? (s?y thai khng hon ton).  Thu?c, ch?ng h?n nh?: ? Thu?c khng sinh ?? ?i?u tr? nhi?m trng. ? Thu?c ?? gip c? th? ??y b?t c? ph?n m no cn l?i ra. ? Thu?c ?? gi?m (co) kch th??c c?a t? cung. Nh?ng thu?c ny c th? ???c dng n?u qu v? ch?y nhi?u mu. N?u qu v? c nhm mu Rh m tnh ho?c con qu v? c Rh d??ng tnh, qu v? s? c?n m?t m?i thu?c tim tn l Rh globulin mi?n d?ch?? b?o v? cc con sau ny c?a qu v? kh?i b? cc v?n ??  v? nhm mu Rh. "Rh m tnh" v "Rh d??ng tnh" ?? c?p ??n vi?c mu c m?t lo?i protein ??c hi?u tm th?y trn b? m?t h?ng c?u (y?u t? Rh) hay khng. Tun th? nh?ng h??ng d?n ny ? nh: Thu?c   Ch? s? d?ng thu?c khng k ??n v thu?c k ??n theo ch? d?n c?a chuyn gia ch?m [place] s?c kh?e.  N?u qu v? ???c k thu?c khng sinh, hy dng thu?c theo ch? d?n c?a chuyn gia ch?m [place] s?c kh?e. Khng d?ng u?ng thu?c khng sinh ngay c? khi qu v? b?t ??u c?m th?y ?? h?n.  Khng dng thu?c ch?ng vim khng steroid (NSAID), ch?ng h?n nh? aspirin v ibuprofen, tr? khi ???c chuyn gia ch?m [place] s?c kh?e cho php. Nh?ng thu?c ny c th? gy ch?y mu. Ho?t ??ng  Ngh? ng?i theo ch? d?n. H?i chuyn gia ch?m [place] s?c kh?e v? cc ho?t ??ng no an ton cho qu v?.  Nh? ai ? gip gi?i quy?t cc trch nhi?m gia ?nh v vi?c nh trong th?i gian ny. H??ng d?n chung  Theo di s? l??ng b?ng v? sinh qu v? s? d?ng m?i ngy v m?c ?? ??t (bo [place]) c?a b?ng v? sinh. Ghi l?i thng tin ny.  Theo di l??ng m ho?c c?c mu ?ng ra kh?i m ??o c?a qu v?. Gi? l?i b?t c? l??ng m no l?n cho chuyn gia ch?m [place] s?c kh?e ki?m tra.  Khng s?? du?ng nu?t b?ng v? sinh, thu?t r??a ho?c quan h? tnh d?c cho ??n khi ???c chuyn  gia ch?m so?c s??c kho?e ch?p thu?n.  ?? gip qu v? v b?n tnh c?a qu v? v??t qua giai ?o?n ?au bu?n, hy trao ??i v?i chuyn gia ch?m [place] s?c kh?e ho?c tm ng??i t? v?n.  Khi qu v? s?n sng, hy g?p chuyn gia ch?m [place] s?c kh?e ?? th?o lu?n v? b?t c? b??c quan tr?ng no c?n th?c hi?n v s?c kh?e c?a qu v?. ??ng th?i, th?o lu?n nh?ng b??c qu v? c?n th?c hi?n ?? c m?t thai k? kh?e m?nh trong t??ng lai.  Tun th? t?t c? cc l?n khm theo di theo ch? d?n c?a chuyn gia ch?m [place] s?c kh?e. ?i?u ny c vai tr quan tr?ng. N?i ?? tm thm thng tin  Hi?p h?i S?c kh?e S?n Ph? John Giovanni K?: [email or web address]  B? Y t? v D?ch v? [place] Ph? n?: [email or web address] Hy lin l?c v?i chuyn gia ch?m [place] s?c kh?e n?u:  Qu v? b? s?t ho?c ?n l?nh.  Qu v? c kh h? ? m ??o mi kh ch?u.  Qu v? b? ch?y mu nhi?u h?n thay v t h?n. Yu c?u tr? gip ngay l?p t?c n?u:  Qu v? b? co th?t n?ng ho?c ?au r?t nhi?u ? l?ng ho?c b?ng.  Qu v? c c?c mu ?ng ho?c m thot ra kh?i m ??o c kch th??c b?ng h?t c ch tr? ln.  Qu v? lm ??t h?n 1 mi?ng b?ng v? sinh bnh th??ng trong m?t gi?Sander Nephew v? b? chong vng ho?c ?m y?u.  Qu v? b? b?t t?nh.  Qu v? c c?m gic bu?n xm chi?m t? t??ng c?a mnh ho?c c suy ngh? t? lm h?i mnh. Tm t?t  H?u h?t cc tr??ng h?p s?y [place] x?y ra trong 3 thng ??u c?a [place] k?. ?i khi s?y [place] x?y ra tr??c c? khi ng??i ph? n? bi?t mnh c [place].  Tun th? ch? ??n c?a chuyn gia ch?m [place] s?c kh?e v? vi?c ch?m [place] ? nh. Tun theo t?t c? cc cu?c h?n khm l?i.  ?? gip qu v? v b?n tnh c?a qu v? v??t qua giai ?o?n ?au bu?n, hy trao ??i v?i chuyn gia ch?m [place] s?c kh?e ho?c tm ng??i t? v?n. Thng tin ny khng nh?m m?c ?ch thay th? cho l?i khuyn m chuyn gia ch?m [place] s?c kh?e ni v?i qu v?. Hy b?o ??m qu v? ph?i th?o lu?n b?t k? v?n ?? g m qu v? c v?i chuyn gia ch?m [place] s?c kh?e c?a qu v?. Document Revised:  10/07/2016 Document Reviewed: 10/07/2016 Elsevier Patient Education  2020 [employer].

## 2019-07-21 NOTE — Progress Notes (Signed)
Subjective:  Tracy Stanley is a 39 y.o. G2P1011 at [redacted]w[redacted]d who presents today for FU BHCG. She was seen on 07/04/2019. Results from that day show no IUP on [place], and HCG 11,000. She denies vaginal bleeding. She denies abdominal or pelvic pain.  Objective:   Physical Exam  Nursing note and vitals reviewed. Constitutional: She is oriented to person, place, and time. She appears well-developed and well-nourished. No distress.  HENT:  Head: Normocephalic.  Cardiovascular: Normal rate.  Respiratory: Effort normal.  GI: Soft. There is no tenderness.  Neurological: She is alert and oriented to person, place, and time. Skin: Skin is warm and dry.  Psychiatric: She has a normal mood and affect.   No results found for this or any previous visit (from the past 24 hour(s)).  Assessment/Plan:  Support given Interpretor used  Confirmed SAB B positive blood type  Last HCG: 11,578 FU in 1 weeks for: non stat HCG   Basia Mcginty, Harolyn Rutherford, NP 07/21/2019 10:54 AM

## 2019-07-22 LAB — BETA HCG QUANT (REF LAB): hCG Quant: 9 m[IU]/mL

## 2019-07-22 NOTE — Progress Notes (Signed)
Her Quant is down, she will need another non stat Quant in the office in 1 week please. Thank you!  Victorino Dike

## 2019-07-26 ENCOUNTER — Other Ambulatory Visit: Payer: Self-pay | Admitting: General Practice

## 2019-07-26 DIAGNOSIS — O039 Complete or unspecified spontaneous abortion without complication: Secondary | ICD-10-CM

## 2019-07-28 ENCOUNTER — Other Ambulatory Visit: Payer: 59

## 2019-07-28 ENCOUNTER — Other Ambulatory Visit: Payer: Self-pay

## 2019-07-28 DIAGNOSIS — O039 Complete or unspecified spontaneous abortion without complication: Secondary | ICD-10-CM

## 2019-07-29 ENCOUNTER — Telehealth: Payer: Self-pay | Admitting: *Deleted

## 2019-07-29 LAB — BETA HCG QUANT (REF LAB): hCG Quant: 2 m[IU]/mL

## 2019-07-29 NOTE — Progress Notes (Signed)
Her Quant is now negative. I am off today, can you please call her and inform her of results. Thank you so much!   Victorino Dike, NP

## 2019-07-29 NOTE — Telephone Encounter (Addendum)
-----   Message from Duane Lope, NP sent at 07/29/2019  8:06 AM EDT ----- Her [name] is now negative. I am off today, can you please call her and inform her of results. Thank you so much!   Victorino Dike, NP   3/26  1120  Called pt w/interpreter #[phone] and pt did not answer. A message was left stating that her recent blood test was normal. No additional blood tests are needed. She may call the office if she has questions.

## 2020-05-25 ENCOUNTER — Telehealth (HOSPITAL_COMMUNITY): Payer: Self-pay

## 2020-05-25 NOTE — Telephone Encounter (Signed)
Encounter was opened in error

## 2020-07-25 IMAGING — US US OB < 14 WEEKS - US OB TV
1 series · 15 of 28 positions shown · non-contrast
Comparison: None.

CLINICAL DATA: Vaginal bleeding

EXAM:
OBSTETRIC <14 WK US AND TRANSVAGINAL OB US
TECHNIQUE: Both transabdominal and transvaginal ultrasound examinations were
performed for complete evaluation of the gestation as well as the
maternal uterus, adnexal regions, and pelvic cul-de-sac.
Transvaginal technique was performed to assess early pregnancy.

[Series 1: us ob < 14 weeks - us ob tv · 15 of 31 slices shown]
[im 1/31]
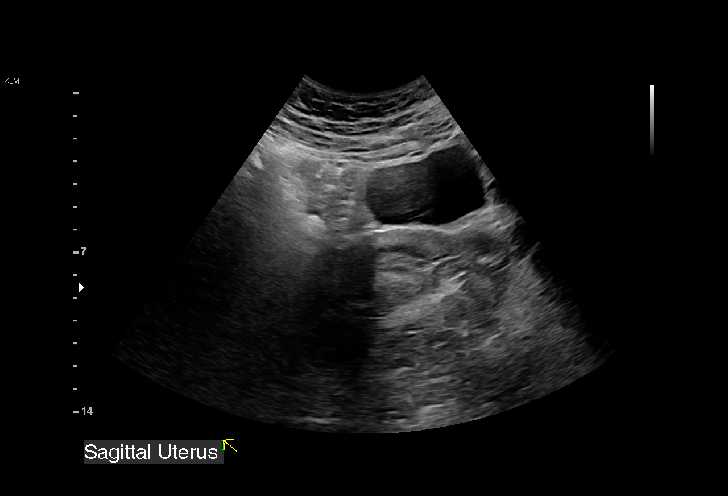
[im 3/31]
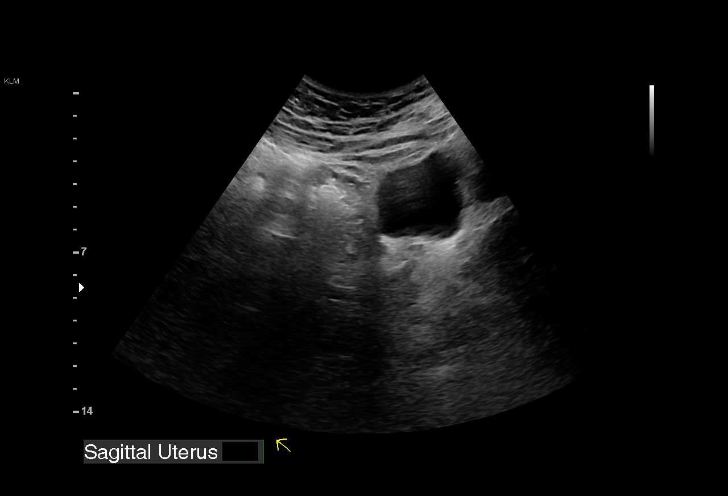
[im 5/31]
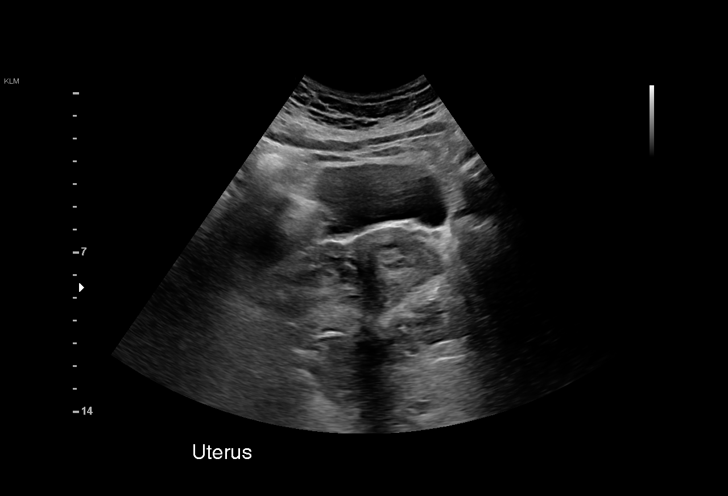
[im 7/31]
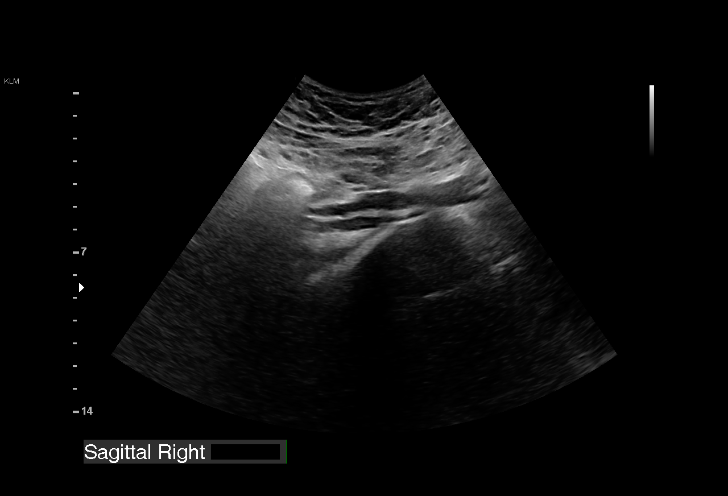
[im 9/31]
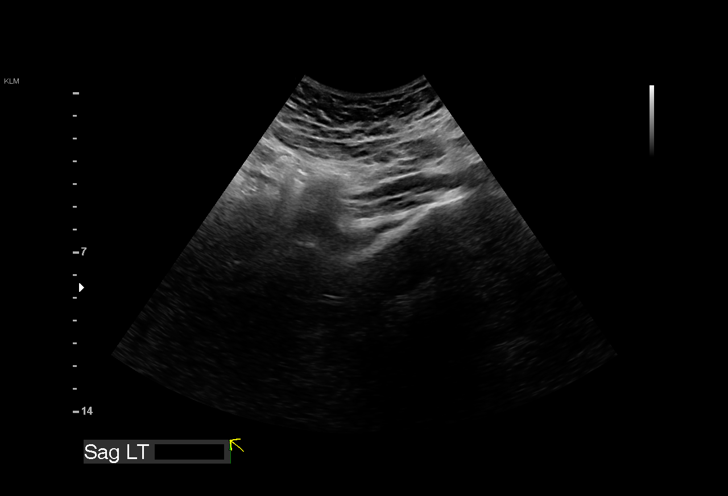
[im 12/31]
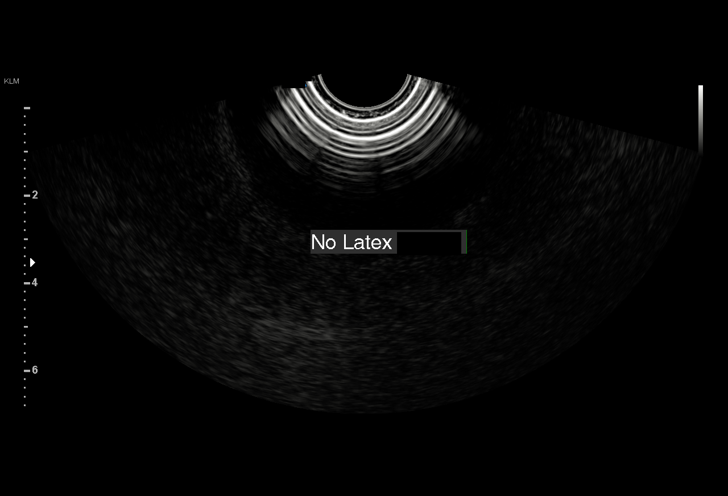
[im 14/31]
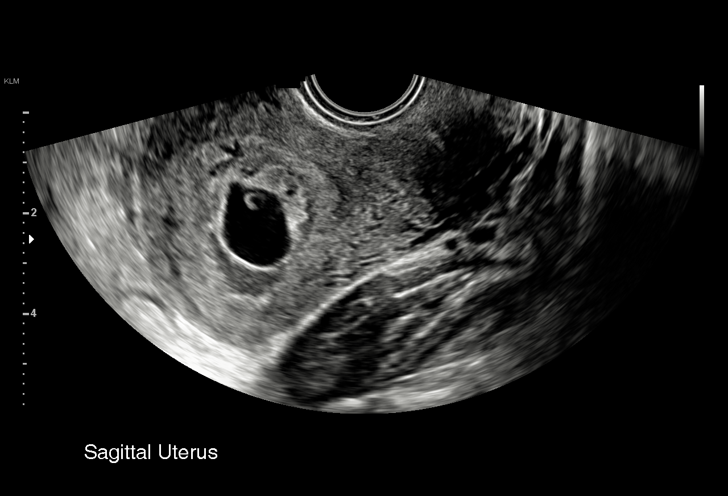
[im 16/31]
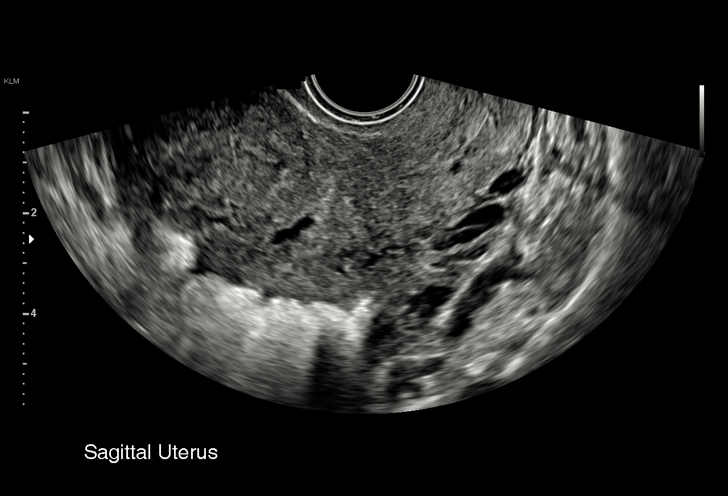
[im 17/31]
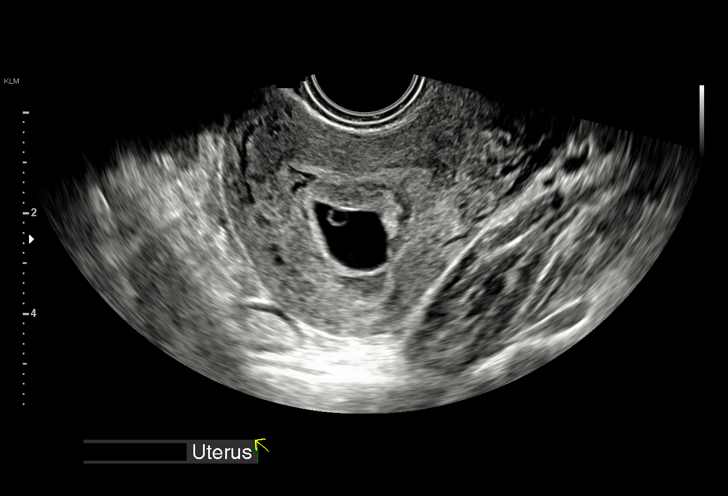
[im 19/31]
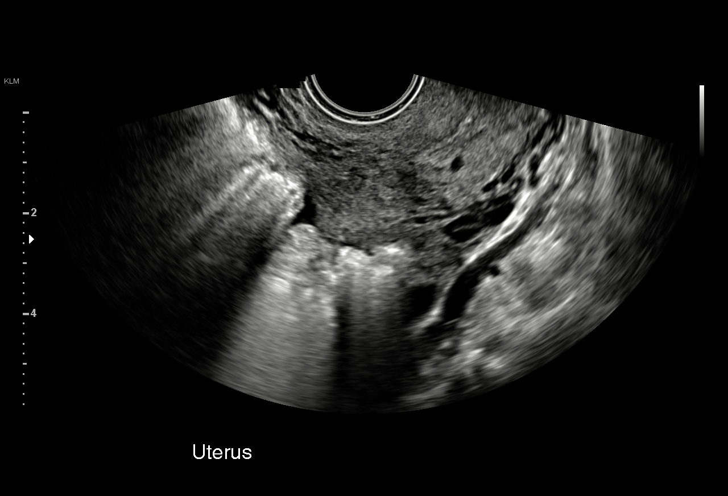
[im 22/31]
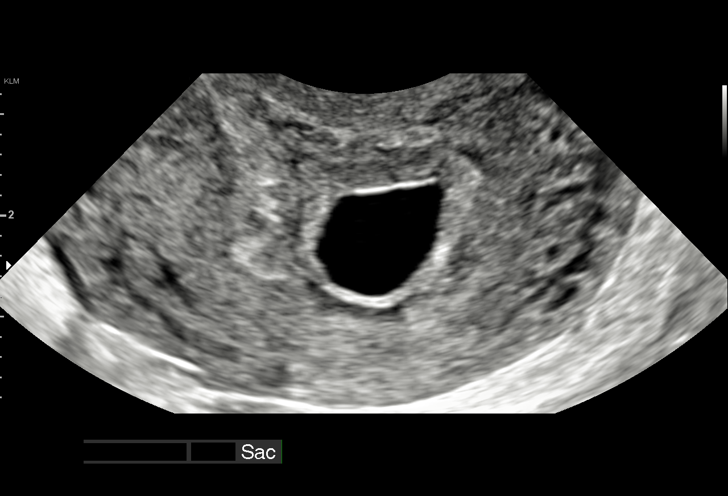
[im 24/31]
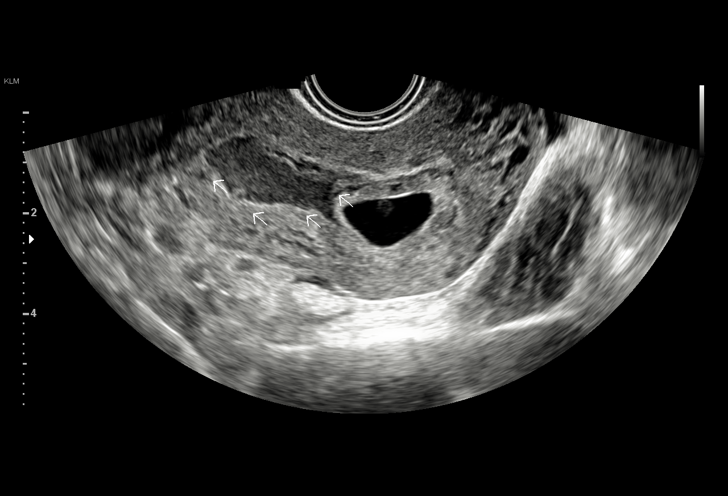
[im 26/31]
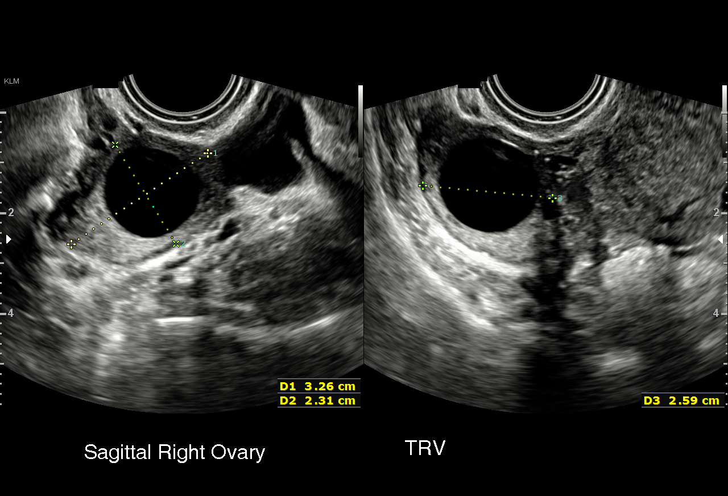
[im 28/31]
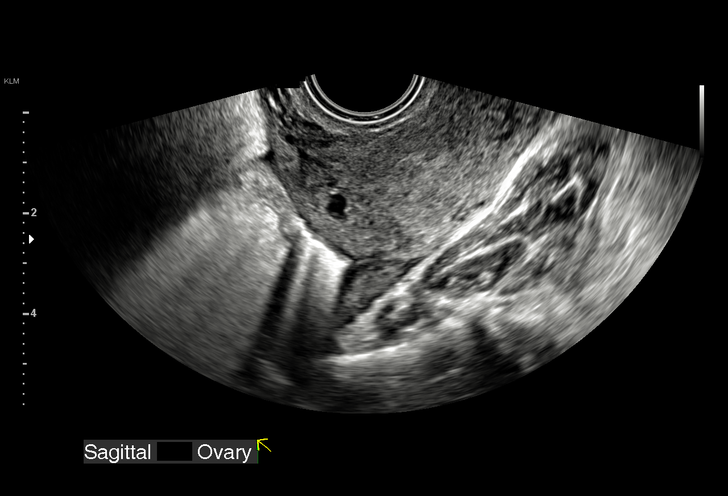
[im 31/31]
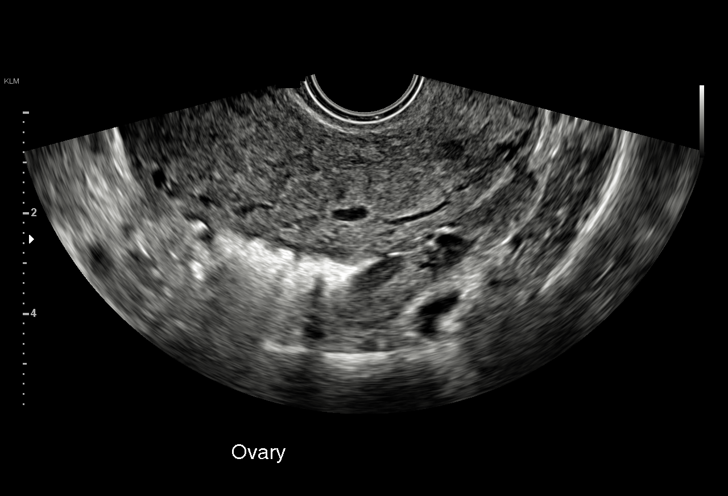

[15 of 28 positions shown; findings below may reference images not displayed]

FINDINGS: Intrauterine gestational sac: Single

Yolk sac:  Visualized.

Embryo:  Not Visualized.

Cardiac Activity: Not Visualized.

MSD: 13.5 mm   6 w   1 d

Subchorionic hemorrhage: There is a moderate volume of subchorionic
hemorrhage.

Maternal uterus/adnexae: There is no significant maternal
abnormality detected. No significant free fluid.
IMPRESSION: Single IUP at 6 weeks and 1 day without evidence for an embryo.
Given the patient is greater than 6 weeks post last menstrual
period, findings are suspicious for a failed pregnancy.

There is a moderate volume of subchorionic hemorrhage.

Findings are suspicious but not yet definitive for failed pregnancy.
Recommend follow-up US in 10-14 days for definitive diagnosis. This
recommendation follows SRU consensus guidelines: Diagnostic Criteria
for Nonviable Pregnancy Early in the First Trimester. N Engl J Med

## 2020-07-29 IMAGING — US US OB TRANSVAGINAL
1 series · 15 of 28 positions shown · non-contrast
Comparison: Pelvic ultrasound dated 06/30/2019.

CLINICAL DATA: 38-year-old female with pelvic pain and vaginal
bleeding.

EXAM:
TRANSVAGINAL OB ULTRASOUND
TECHNIQUE: Transvaginal ultrasound was performed for complete evaluation of the
gestation as well as the maternal uterus, adnexal regions, and
pelvic cul-de-sac.

[Series 1: us ob transvaginal · 37 acquisitions, 15 frames shown]
[im 1/37]
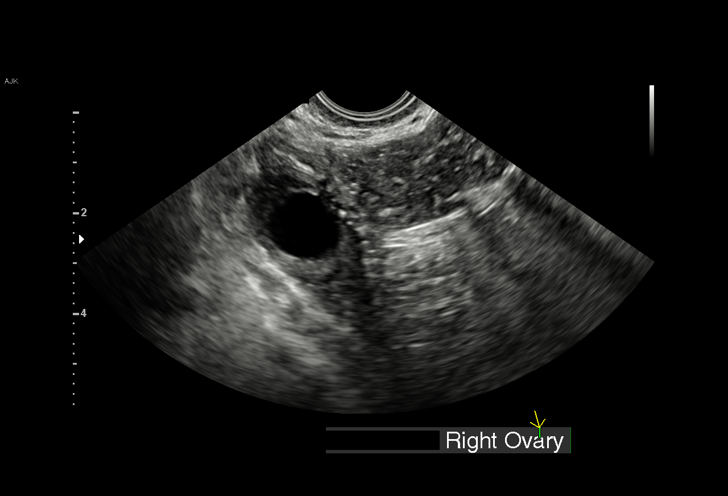
[im 3/37]
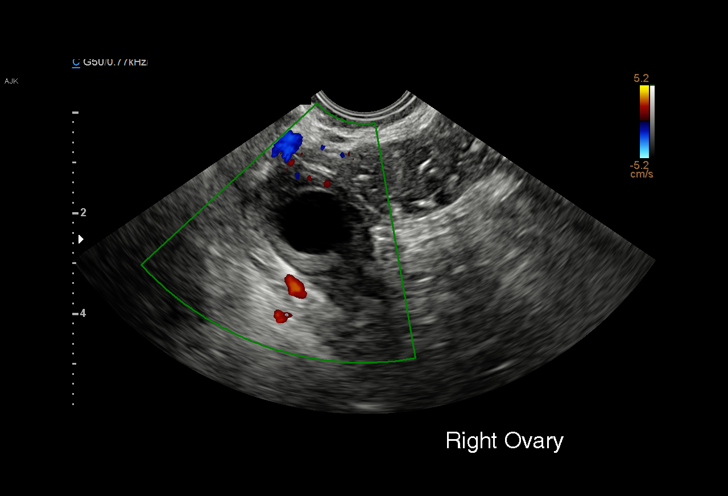
[im 6/37]
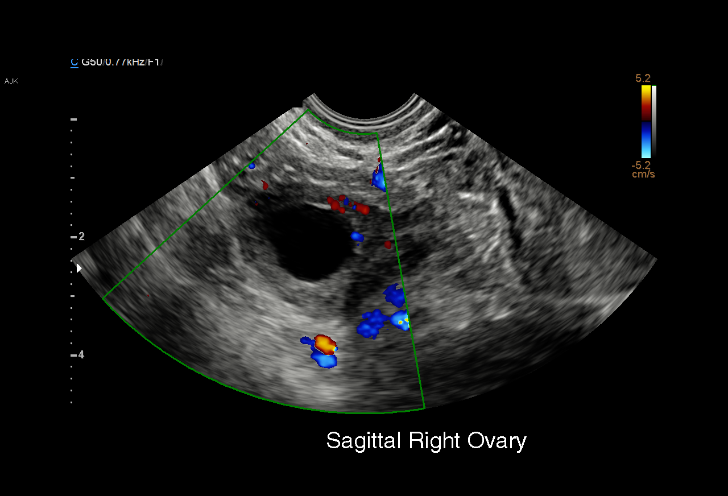
[im 9/37]
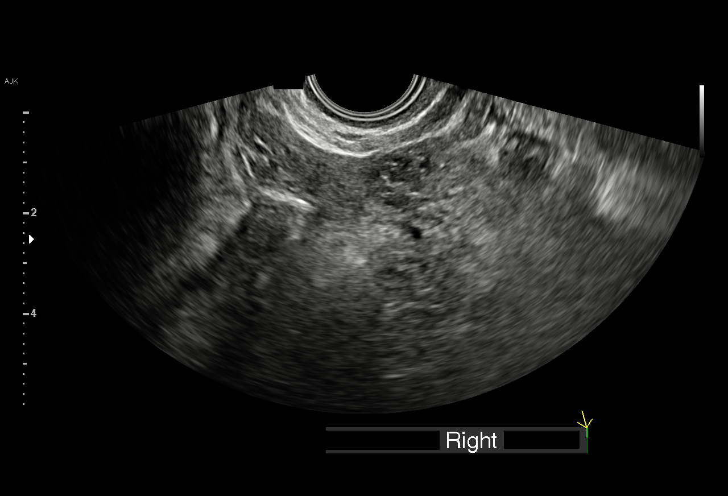
[im 11/37]
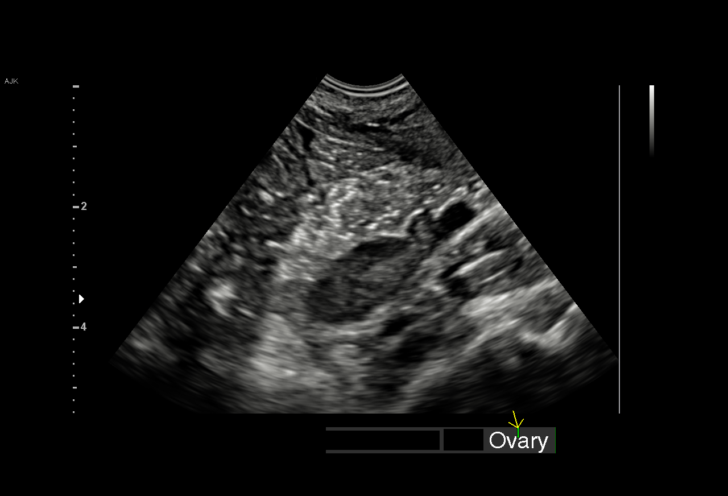
[im 14/37]
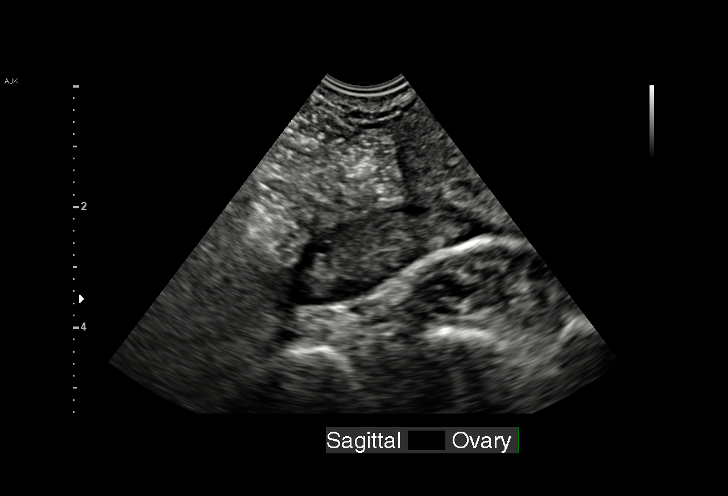
[im 17/37]
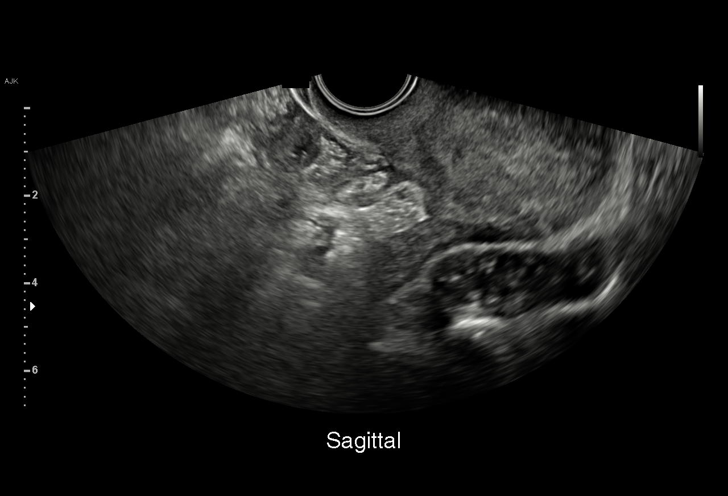
[im 19/37]
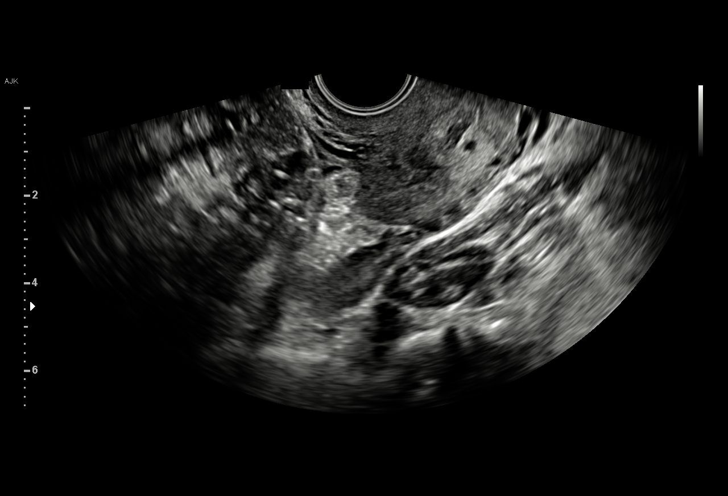
[im 21/37]
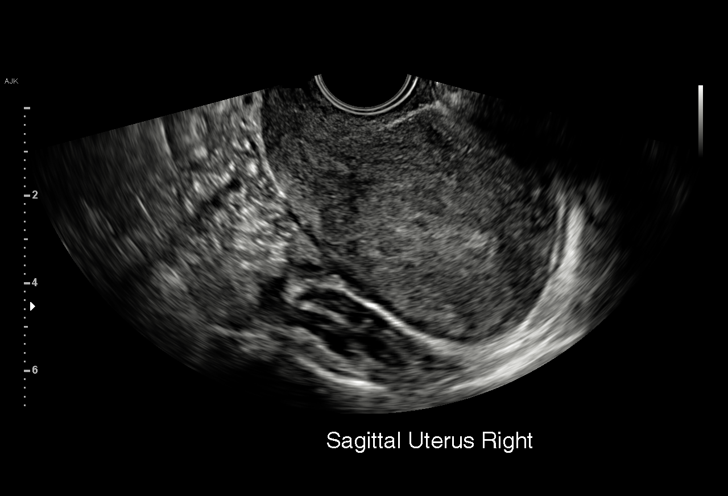
[im 23/37]
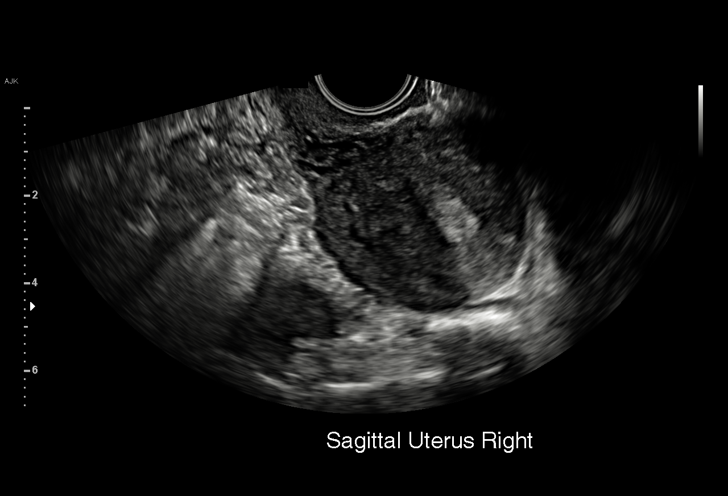
[im 26/37]
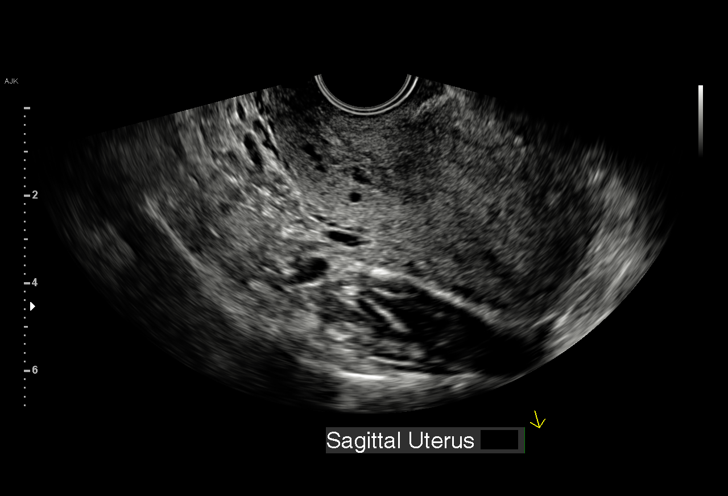
[im 29/37]
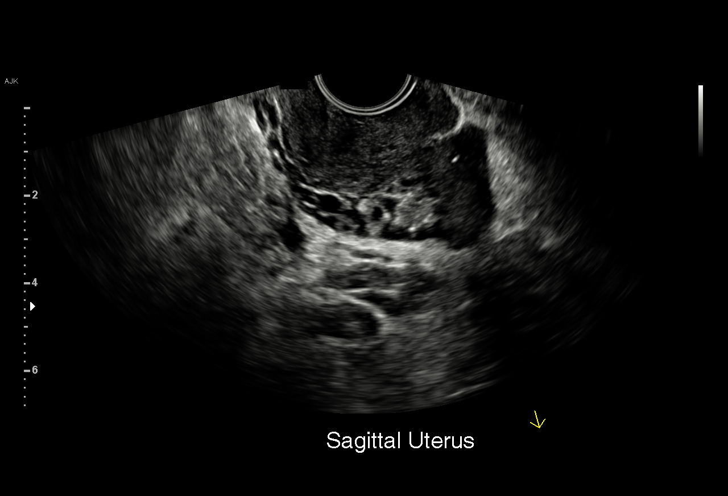
[im 31/37]
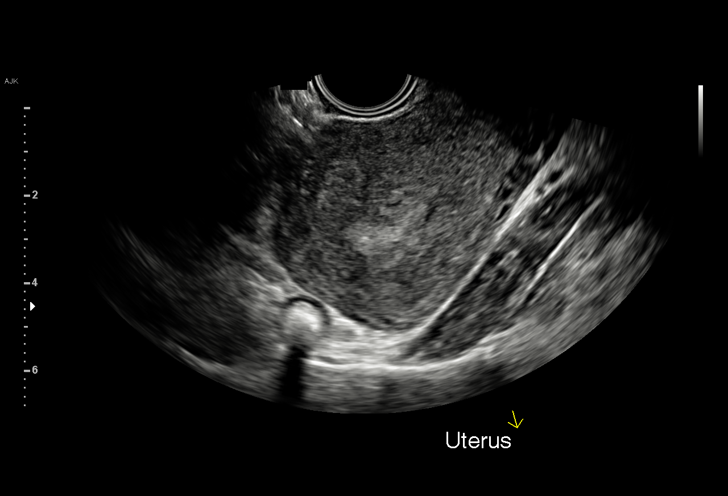
[im 34/37]
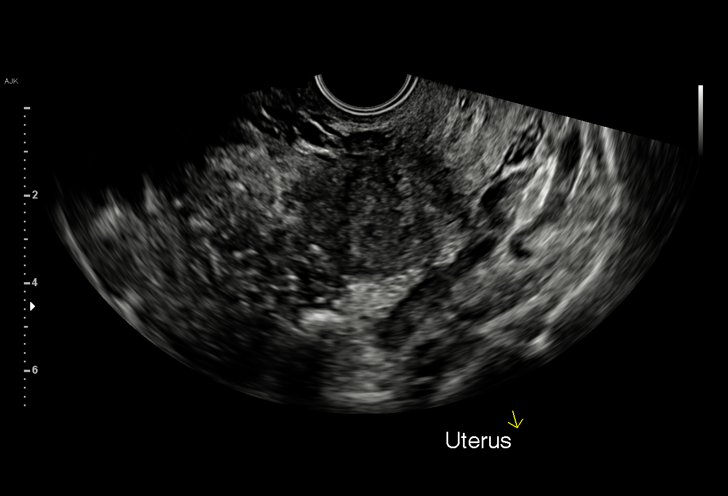
[im 37/37]
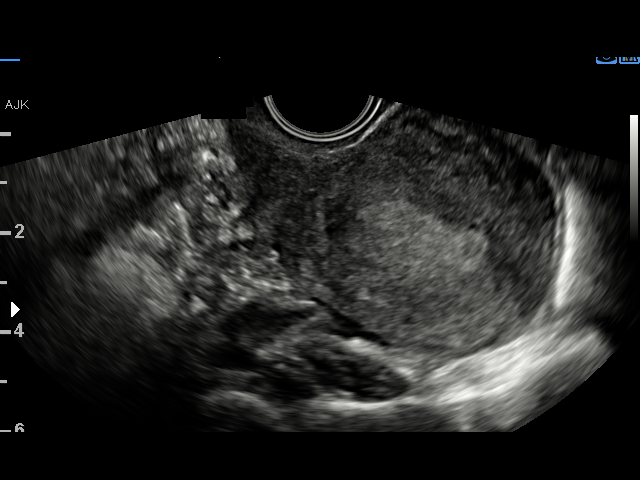

[15 of 28 positions shown; findings below may reference images not displayed]

FINDINGS: The uterus is retroverted or retroflexed.

The endometrium is thickened and slightly heterogeneous measuring up
to 11 mm in thickness. No intrauterine pregnancy identified. A small
complex hypoechoic collection in the lower endometrium likely
represents blood product/clot. Retained products of conception is
less likely but not excluded. Clinical correlation and follow-up
with serial HCG levels recommended.

The ovaries are unremarkable. There is a dominant follicle or cyst
in the right ovary.

No significant free fluid within the pelvis.
IMPRESSION: Interval passage of the previously seen intrauterine gestational sac
in keeping with spontaneous miscarriage. Small amount of blood/clot
within the endometrium. Clinical correlation and follow-up with
serial HCG levels recommended to exclude retained products of
conception.

## 2021-02-19 ENCOUNTER — Other Ambulatory Visit: Payer: Self-pay | Admitting: Obstetrics and Gynecology

## 2021-02-19 ENCOUNTER — Ambulatory Visit
Admission: RE | Admit: 2021-02-19 | Discharge: 2021-02-19 | Disposition: A | Discharge status: Home / Self Care | Payer: 59 | Source: Ambulatory Visit | Attending: Obstetrics and Gynecology | Admitting: Obstetrics and Gynecology

## 2021-02-19 DIAGNOSIS — Z111 Encounter for screening for respiratory tuberculosis: Secondary | ICD-10-CM

## 2022-03-17 IMAGING — CR DG CHEST 1V
1 series · 1 of 1 positions shown · non-contrast
Comparison: None.

CLINICAL DATA: 40-year-old female with PPD screening test

EXAM:
CHEST  1 VIEW

[w chest pa]
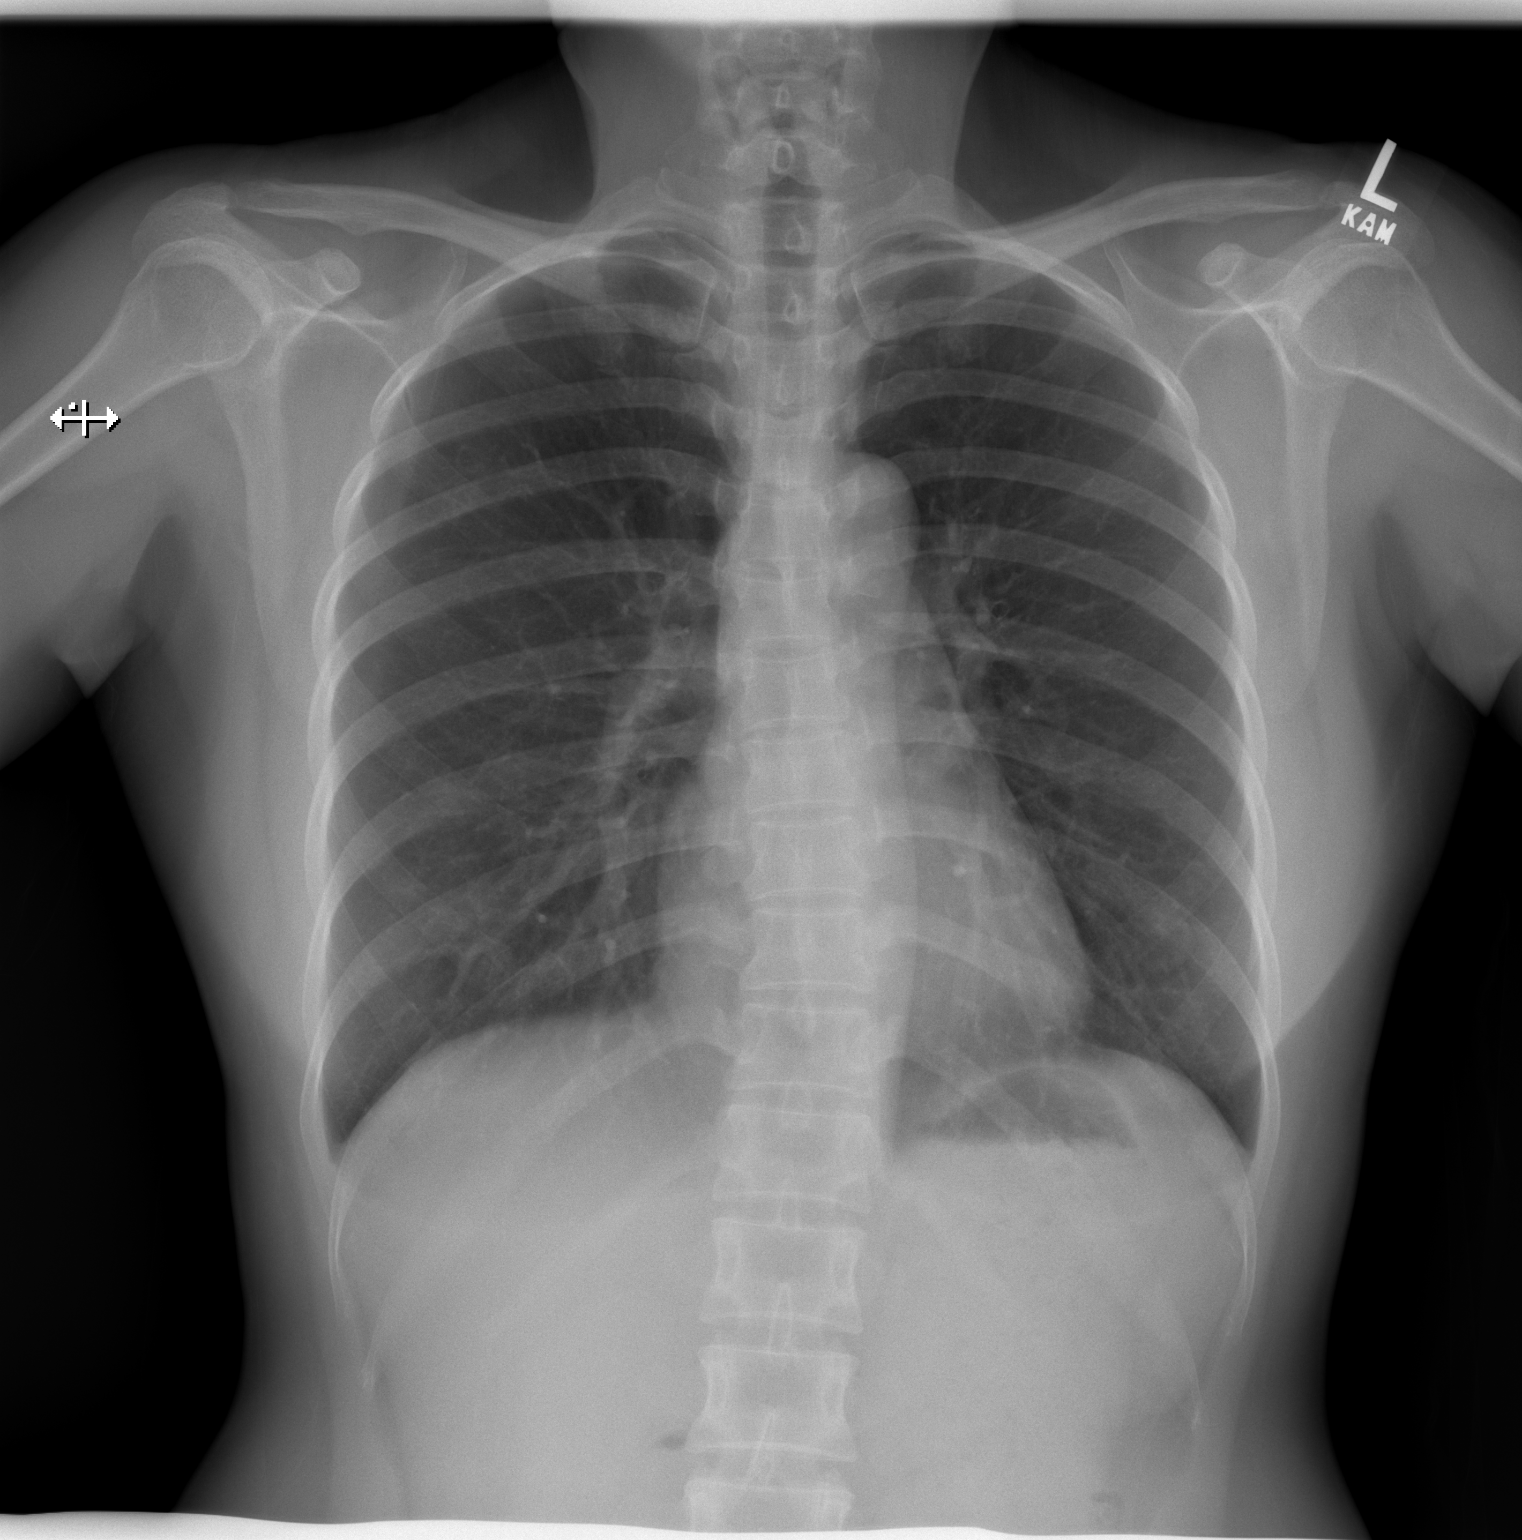

[1 of 1 positions shown; findings below may reference images not displayed]

FINDINGS: The heart size and mediastinal contours are within normal limits.
Both lungs are clear. The visualized skeletal structures are
unremarkable.
IMPRESSION: No active disease.

## 2022-03-22 ENCOUNTER — Inpatient Hospital Stay (HOSPITAL_COMMUNITY): Payer: PRIVATE HEALTH INSURANCE

## 2022-03-22 ENCOUNTER — Inpatient Hospital Stay (HOSPITAL_COMMUNITY)
Admission: AD | Admit: 2022-03-22 | Discharge: 2022-03-22 | Disposition: A | Discharge status: Home / Self Care | Payer: PRIVATE HEALTH INSURANCE | Attending: Obstetrics & Gynecology | Admitting: Obstetrics & Gynecology

## 2022-03-22 ENCOUNTER — Encounter (HOSPITAL_COMMUNITY): Payer: Self-pay | Admitting: Obstetrics & Gynecology

## 2022-03-22 DIAGNOSIS — O021 Missed abortion: Secondary | ICD-10-CM | POA: Diagnosis not present

## 2022-03-22 DIAGNOSIS — Z3687 Encounter for antenatal screening for uncertain dates: Secondary | ICD-10-CM | POA: Diagnosis present

## 2022-03-22 DIAGNOSIS — Z6790 Unspecified blood type, Rh positive: Secondary | ICD-10-CM

## 2022-03-22 HISTORY — DX: Unspecified asthma, uncomplicated: J45.909

## 2022-03-22 LAB — CBC
HCT: 42.7 % (ref 36.0–46.0)
Hemoglobin: 13.4 g/dL (ref 12.0–15.0)
MCH: 20.2 pg — ABNORMAL LOW (ref 26.0–34.0)
MCHC: 31.4 g/dL (ref 30.0–36.0)
MCV: 64.3 fL — ABNORMAL LOW (ref 80.0–100.0)
Platelets: 192 10*3/uL (ref 150–400)
RBC: 6.64 MIL/uL — ABNORMAL HIGH (ref 3.87–5.11)
RDW: 18.5 % — ABNORMAL HIGH (ref 11.5–15.5)
WBC: 9.3 10*3/uL (ref 4.0–10.5)
nRBC: 0 % (ref 0.0–0.2)

## 2022-03-22 LAB — HCG, QUANTITATIVE, PREGNANCY: hCG, Beta Chain, Quant, S: 3490 m[IU]/mL — ABNORMAL HIGH (ref <5)

## 2022-03-22 NOTE — MAU Provider Note (Signed)
History     CSN: [id number]  Arrival date and time: 03/22/22 1814   Event Date/Time   First Provider Initiated Contact with Patient 03/22/22 1851      Chief Complaint  Patient presents with  . Vaginal Bleeding   Vaginal Bleeding  Tracy Stanley is a 41 y.o. G3P1011 at [redacted]w[redacted]d who presents to MAU with chief complaint of vaginal bleeding. This is a new problem, onset yesterday. Patient endorses seeing blood when she wipes after voiding. No previous episodes of bleeding. She has not needed to don a pad. She is not saturating her clothing. She denies dizziness, weakness, syncope. Pain score 0/10.  Patient has initiated care with [facility].   OB History     Gravida  3   Para  1   Term  1   Preterm  0   AB  1   Living  1      SAB  1   IAB  0   Ectopic  0   Multiple  0   Live Births  1           Past Medical History:  Diagnosis Date  . Asthma     Past Surgical History:  Procedure Laterality Date  . NO PAST SURGERIES      Family History  Problem Relation Age of Onset  . Healthy Mother   . Hypertension Father     Social History   Tobacco Use  . Smoking status: Never  . Smokeless tobacco: Never  Vaping Use  . Vaping Use: Never used  Substance Use Topics  . Alcohol use: Never  . Drug use: Never    Allergies: No Known Allergies  Medications Prior to Admission  Medication Sig Dispense Refill Last Dose  . Prenatal Vit-Fe Fumarate-FA (MULTIVITAMIN-PRENATAL) 27-0.8 MG TABS tablet Take 1 tablet by mouth daily at 12 noon.   03/22/2022    Review of Systems  Genitourinary:  Positive for vaginal bleeding.  All other systems reviewed and are negative.  Physical Exam   Blood pressure 122/76, pulse 88, temperature 98.6 F (37 C), temperature source Oral, resp. rate 16, height 5\' 2"  (1.575 m), weight 59.7 kg, SpO2 100 %.  Physical Exam Vitals and nursing note reviewed. Exam conducted with a chaperone present.  Constitutional:      Appearance: Normal  appearance. She is not ill-appearing.  Cardiovascular:     Rate and Rhythm: Normal rate and regular rhythm.     Pulses: Normal pulses.     Heart sounds: Normal heart sounds.  Pulmonary:     Effort: Pulmonary effort is normal.     Breath sounds: Normal breath sounds.  Abdominal:     General: Abdomen is flat.     Tenderness: There is no abdominal tenderness.  Skin:    Capillary Refill: Capillary refill takes less than 2 seconds.  Neurological:     Mental Status: She is alert and oriented to person, place, and time.  Psychiatric:        Mood and Affect: Mood normal.        Behavior: Behavior normal.        Thought Content: Thought content normal.        Judgment: Judgment normal.    MAU Course  Procedures  MDM  --Unable to clearly visualize fetus with bedside. Will order formal  Patient Vitals for the past 24 hrs:  BP Temp Temp src Pulse Resp SpO2 Height Weight  03/22/22 1833 122/76 98.6 F (37 C)  Oral 88 16 100 % 5\' 2"  (1.575 m) 59.7 kg   Orders Placed This Encounter  Procedures  . OB LESS THAN 14 WEEKS WITH OB TRANSVAGINAL  . CBC  . hCG, quantitative, pregnancy      Assessment and Plan  ***  [place], MSA, MSN, CNM 03/22/2022, 7:49 PM

## 2022-03-22 NOTE — MAU Note (Signed)
Tracy Stanley is a 41 y.o. at Unknown here in MAU reporting: getting care in .  Has had [place], everything was fine.  Started bleeding yesterday.when she wipes, she sees blood mixed with mucous, none on her pad,  has seen a few drops on her underwear. This is first episode of bleeding. Denies pain.  Onset of complaint: yesterday Pain score: none Vitals:   03/22/22 1833  BP: 122/76  Pulse: 88  Resp: 16  Temp: 98.6 F (37 C)  SpO2: 100%      Lab orders placed from triage:  urine collected

## 2024-01-01 ENCOUNTER — Other Ambulatory Visit: Payer: Self-pay | Admitting: Nurse Practitioner

## 2024-01-01 DIAGNOSIS — Z1231 Encounter for screening mammogram for malignant neoplasm of breast: Secondary | ICD-10-CM

## 2024-03-22 ENCOUNTER — Ambulatory Visit
Admission: RE | Admit: 2024-03-22 | Discharge: 2024-03-22 | Disposition: A | Discharge status: Home / Self Care | Source: Ambulatory Visit | Attending: Nurse Practitioner | Admitting: Nurse Practitioner

## 2024-03-22 DIAGNOSIS — Z1231 Encounter for screening mammogram for malignant neoplasm of breast: Secondary | ICD-10-CM
